# Patient Record
Sex: Male | Born: 1960 | Race: White | Hispanic: No | Marital: Married | Smoking: Former smoker
Health system: Southern US, Community
[De-identification: ages and names within clinical notes are randomized; demographics above are authoritative.]

## PROBLEM LIST (undated history)

## (undated) DIAGNOSIS — E119 Type 2 diabetes mellitus without complications: Secondary | ICD-10-CM

## (undated) DIAGNOSIS — E785 Hyperlipidemia, unspecified: Secondary | ICD-10-CM

## (undated) DIAGNOSIS — I2510 Atherosclerotic heart disease of native coronary artery without angina pectoris: Secondary | ICD-10-CM

## (undated) DIAGNOSIS — I456 Pre-excitation syndrome: Secondary | ICD-10-CM

## (undated) DIAGNOSIS — I10 Essential (primary) hypertension: Secondary | ICD-10-CM

## (undated) DIAGNOSIS — E781 Pure hyperglyceridemia: Secondary | ICD-10-CM

## (undated) HISTORY — DX: Essential (primary) hypertension: I10

## (undated) HISTORY — DX: Hyperlipidemia, unspecified: E78.5

## (undated) HISTORY — DX: Pre-excitation syndrome: I45.6

## (undated) HISTORY — DX: Pure hyperglyceridemia: E78.1

## (undated) HISTORY — DX: Type 2 diabetes mellitus without complications: E11.9

## (undated) HISTORY — DX: Atherosclerotic heart disease of native coronary artery without angina pectoris: I25.10

---

## 2003-01-15 ENCOUNTER — Inpatient Hospital Stay (HOSPITAL_COMMUNITY): Admission: AD | Admit: 2003-01-15 | Discharge: 2003-01-16 | Payer: Self-pay | Admitting: Cardiology

## 2003-01-27 ENCOUNTER — Ambulatory Visit (HOSPITAL_COMMUNITY): Admission: RE | Admit: 2003-01-27 | Discharge: 2003-01-27 | Payer: Self-pay | Admitting: Internal Medicine

## 2003-01-27 ENCOUNTER — Encounter: Payer: Self-pay | Admitting: Cardiology

## 2003-09-23 ENCOUNTER — Encounter: Payer: Self-pay | Admitting: Cardiology

## 2003-11-13 ENCOUNTER — Encounter: Payer: Self-pay | Admitting: Cardiology

## 2003-11-18 ENCOUNTER — Encounter: Payer: Self-pay | Admitting: Cardiology

## 2003-11-23 ENCOUNTER — Inpatient Hospital Stay (HOSPITAL_BASED_OUTPATIENT_CLINIC_OR_DEPARTMENT_OTHER): Admission: RE | Admit: 2003-11-23 | Discharge: 2003-11-23 | Payer: Self-pay | Admitting: Cardiovascular Disease

## 2003-11-23 ENCOUNTER — Ambulatory Visit: Payer: Self-pay | Admitting: Cardiovascular Disease

## 2003-12-04 ENCOUNTER — Ambulatory Visit: Payer: Self-pay | Admitting: Cardiology

## 2004-11-22 IMAGING — NM NM MYOCAR MULTI W/ SPECT
15 series · 60 of 60 positions shown · non-contrast
Comparison: none

CLINICAL DATA: Wolff-Parkinson-White syndrome.  Atrial fibrillation and family history of coronary artery disease. 
 No prior studies for comparison. 
 NUCLEAR MEDICINE MYOCARDIAL PERFUSION AND MULTIPLE SPECT IMAGING WITH REST AND EXERCISE STRESS ? 01/16/03 
 EJECTION FRACTION CALCULATION
 WALL MOTION STUDY
 Radiopharmaceutical:  10.0 mCi technetium-33m sestamibi IV at rest and 30.0 mCi technetium-33m sestamibi IV with exercise stress.
 Utilizing gated data, the end-diastolic volume is estimated to be 124 cc and the end-systolic volume 55 cc.  Calculated ejection fraction is 55%.

[Series 1: cr cardiolite low dose · 6.8mm · 6.79mm/px · 4 of 25 frames shown (1 of 7)]
[frame 3/25]
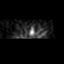
[frame 11/25]
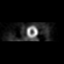
[frame 15/25]
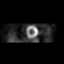
[frame 23/25]
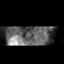

[Series 1: cs cardiolite hi dose · 6.8mm · 6.79mm/px · 4 of 25 frames shown (1 of 8)]
[frame 3/25]
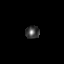
[frame 11/25]
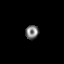
[frame 15/25]
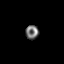
[frame 23/25]
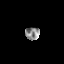

[Series 1: cr cardiolite low dose · 6.8mm · 6.79mm/px · 4 of 19 frames shown (2 of 7)]
[frame 2/19]
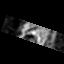
[frame 8/19]
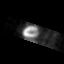
[frame 11/19]
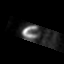
[frame 18/19]
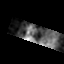

[Series 1: cs cardiolite hi dose · 6.8mm · 6.79mm/px · 4 of 19 frames shown (2 of 8)]
[frame 2/19]
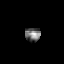
[frame 8/19]
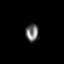
[frame 11/19]
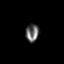
[frame 18/19]
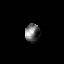

[Series 1: cr cardiolite low dose · 6.8mm · 6.79mm/px · 4 of 19 frames shown (3 of 7)]
[frame 2/19]
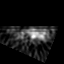
[frame 8/19]
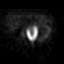
[frame 11/19]
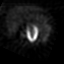
[frame 18/19]
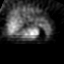

[Series 1: cs cardiolite hi dose · 6.79mm/px · 4 of 64 frames shown (3 of 8)]
[frame 16/64]
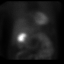
[frame 27/64]
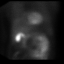
[frame 48/64]
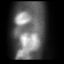
[frame 59/64]
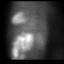

[Series 1: cr cardiolite low dose · 6.8mm · 6.79mm/px · 4 of 25 frames shown (4 of 7)]
[frame 7/25]
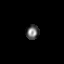
[frame 11/25]
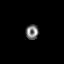
[frame 19/25]
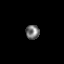
[frame 23/25]
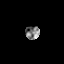

[Series 1: cs cardiolite hi dose · 6.8mm · 6.79mm/px · 4 of 19 frames shown (4 of 8)]
[frame 5/19]
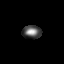
[frame 8/19]
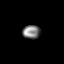
[frame 14/19]
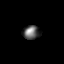
[frame 18/19]
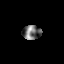

[Series 1: cs cardiolite hi dose · 6.79mm/px · 4 of 510 frames shown (5 of 8)]
[frame 128/510]
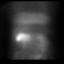
[frame 213/510]
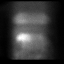
[frame 383/510]
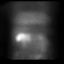
[frame 468/510]
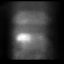

[Series 1: cs cardiolite hi dose · 6.8mm · 6.79mm/px · 4 of 25 frames shown (6 of 8)]
[frame 3/25]
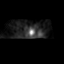
[frame 7/25]
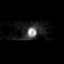
[frame 15/25]
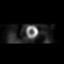
[frame 19/25]
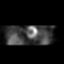

[Series 1: cr cardiolite low dose · 6.8mm · 6.79mm/px · 4 of 19 frames shown (5 of 7)]
[frame 2/19]
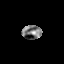
[frame 8/19]
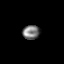
[frame 11/19]
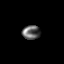
[frame 18/19]
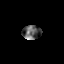

[Series 1: cs cardiolite hi dose · 6.8mm · 6.79mm/px · 4 of 19 frames shown (7 of 8)]
[frame 2/19]
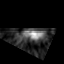
[frame 8/19]
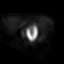
[frame 11/19]
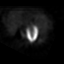
[frame 18/19]
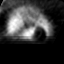

[Series 1: cr cardiolite low dose · 6.8mm · 6.79mm/px · 4 of 19 frames shown (6 of 7)]
[frame 2/19]
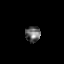
[frame 8/19]
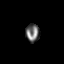
[frame 11/19]
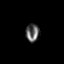
[frame 18/19]
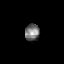

[Series 1: cs cardiolite hi dose · 6.8mm · 6.79mm/px · 4 of 19 frames shown (8 of 8)]
[frame 2/19]
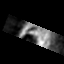
[frame 8/19]
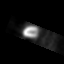
[frame 11/19]
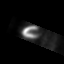
[frame 18/19]
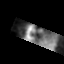

[Series 1: cr cardiolite low dose · 6.79mm/px · 4 of 64 frames shown (7 of 7)]
[frame 6/64]
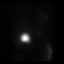
[frame 27/64]
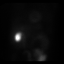
[frame 38/64]
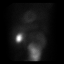
[frame 59/64]
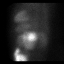

[60 of 60 positions shown; findings below may reference images not displayed]

IMPRESSION: QGS LV ejection fraction of 55%.
 WALL MOTION ANALYSIS
 The gated wall motion study shows normal left ventricular wall motion.
IMPRESSION: Normal wall motion study.
 MULTIPLE SPECT IMAGING WITH REST AND EXERCISE STRESS 
 The patient was exercised on a treadmill, achieving a maximal heart rate of 164 beats per minute.  This is just above 90% of predicted maximum for age.  The patient achieved stage IV of Bruce protocol.  SPECT imaging is normal on both rest and stress studies with no evidence of inducible ischemia or fixed scar.  Very minimal attenuation of the basilar portion of the inferior wall on both stress and rest studies, which is confirmed to be secondary to the diaphragm on the raw planar images.
IMPRESSION: No evidence of ischemia or scar.

## 2004-12-29 ENCOUNTER — Encounter: Payer: Self-pay | Admitting: Cardiology

## 2005-10-06 ENCOUNTER — Ambulatory Visit: Payer: Self-pay | Admitting: Cardiology

## 2009-03-10 ENCOUNTER — Encounter: Payer: Self-pay | Admitting: Cardiology

## 2009-06-13 ENCOUNTER — Ambulatory Visit: Payer: Self-pay | Admitting: Internal Medicine

## 2009-06-13 ENCOUNTER — Encounter: Payer: Self-pay | Admitting: Cardiology

## 2009-06-13 ENCOUNTER — Inpatient Hospital Stay (HOSPITAL_COMMUNITY): Admission: EM | Admit: 2009-06-13 | Discharge: 2009-06-16 | Payer: Self-pay | Admitting: Internal Medicine

## 2009-06-15 ENCOUNTER — Encounter: Payer: Self-pay | Admitting: Cardiology

## 2009-06-16 ENCOUNTER — Encounter: Payer: Self-pay | Admitting: Cardiology

## 2009-06-24 DIAGNOSIS — E782 Mixed hyperlipidemia: Secondary | ICD-10-CM | POA: Insufficient documentation

## 2009-06-25 ENCOUNTER — Encounter (INDEPENDENT_AMBULATORY_CARE_PROVIDER_SITE_OTHER): Payer: Self-pay | Admitting: *Deleted

## 2009-06-25 ENCOUNTER — Ambulatory Visit: Payer: Self-pay | Admitting: Cardiology

## 2009-06-25 DIAGNOSIS — F17200 Nicotine dependence, unspecified, uncomplicated: Secondary | ICD-10-CM | POA: Insufficient documentation

## 2009-06-25 DIAGNOSIS — I2510 Atherosclerotic heart disease of native coronary artery without angina pectoris: Secondary | ICD-10-CM | POA: Insufficient documentation

## 2009-06-25 DIAGNOSIS — I456 Pre-excitation syndrome: Secondary | ICD-10-CM | POA: Insufficient documentation

## 2009-07-12 ENCOUNTER — Telehealth (INDEPENDENT_AMBULATORY_CARE_PROVIDER_SITE_OTHER): Payer: Self-pay | Admitting: *Deleted

## 2009-08-27 ENCOUNTER — Encounter: Payer: Self-pay | Admitting: Cardiology

## 2009-09-06 ENCOUNTER — Ambulatory Visit: Payer: Self-pay | Admitting: Cardiology

## 2009-11-09 ENCOUNTER — Encounter: Payer: Self-pay | Admitting: Cardiology

## 2010-01-07 ENCOUNTER — Encounter: Payer: Self-pay | Admitting: Cardiology

## 2010-01-13 ENCOUNTER — Encounter (INDEPENDENT_AMBULATORY_CARE_PROVIDER_SITE_OTHER): Payer: Self-pay | Admitting: *Deleted

## 2010-01-13 ENCOUNTER — Telehealth (INDEPENDENT_AMBULATORY_CARE_PROVIDER_SITE_OTHER): Payer: Self-pay | Admitting: *Deleted

## 2010-01-14 ENCOUNTER — Ambulatory Visit: Payer: Self-pay | Admitting: Cardiology

## 2010-01-27 ENCOUNTER — Encounter: Payer: Self-pay | Admitting: Cardiology

## 2010-02-15 NOTE — Progress Notes (Signed)
Summary: Nausea/Diaphoresis  Phone Note Call from Patient Call back at Home Phone [phone]   Caller: Tamela Oddi -WIFE Summary of Call: Started sweating very bad and became fatigue while in church yesterday. Came home from church and went to bed. States he is feeling ok today but very concerned since he recently had heart attack. No shortness of breath. Initial call taken by: Zachary George,  July 12, 2009 8:56 AM  Follow-up for Phone Call        Pt states he felt good yesterday (6/26) morning. He took meds and ate a sandwich and then went to church. About 30 minutes after arriving at church, he broke out in a cold sweat, felt nauseated and became pale. He left church and rested. He states symptoms passed and he has felt okay since then. Upon further questioning, he states he had a muscle ache on top of his (L) shoulder. He states this was minor and he is not sure if he had this ache before his other symptoms started. He states he just have paid more attention to it after his other symptoms started. Pt states he is going out of town on vacation tomorrow and wanted to notify us of his symptoms before leaving for vacation.  Follow-up by: Cyril Loosen, RN, BSN,  July 12, 2009 11:12 AM  Additional Follow-up for Phone Call Additional follow up Details #1::        Would keep an eye on things and not hesitate to seek medical attention if symptoms recur/worsen while out of town. Additional Follow-up by: Loreli Slot, MD, [facility],  July 12, 2009 2:17 PM    Additional Follow-up for Phone Call Additional follow up Details #2::    Pt's wife notified and verbalized understanding. Pt's wife also states that she went to [facility] to pick up Lisinopril. Pt had 90-day prescription sent at recent office visit d/t cost. Pt's wife was told by pharmacy that prescription was sent by MD for 30 day supply only. This is not correct. Pt's wife notified prescription will be resent for 90 day supply.    Follow-up by: Cyril Loosen, RN, BSN,  July 12, 2009 3:54 PM  Prescriptions: LISINOPRIL 10 MG TABS (LISINOPRIL) Take 1 tablet by mouth once a day  #90 x 3   Entered by:   Cyril Loosen, RN, BSN   Authorized by:   Loreli Slot, MD, [facility]   Signed by:   Cyril Loosen, RN, BSN on 07/12/2009   Method used:   Electronically to        [facility]* (retail)       [address]       [place], [place]  [address]       Ph: [phone]       Fax: [phone]   RxID:   [id number]

## 2010-02-15 NOTE — Op Note (Signed)
Summary: Operative Report  Operative Report   Imported By: Dorise Hiss 06/24/2009 15:56:02  _____________________________________________________________________  External Attachment:    Type:   Image     Comment:   External Document

## 2010-02-15 NOTE — Letter (Signed)
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 [address]   [place], [place] [address]   Phone: [phone]  Fax: [phone]    06/25/2009  TO: Leodis Sias IT MAY CONCERN   RE: [name] [address] [id number]   The above named individual is under my medical care and may return to work on: July 13, 2009 without restrictions.  If you have any further questions or need additional information, please call.     Sincerely,    Lawrence Loosen, RN, BSN

## 2010-02-15 NOTE — Assessment & Plan Note (Signed)
Summary: EPH-POST [facility]   Visit Type:  Follow-up Primary Provider:  Dr. Donzetta Sprung   History of Present Illness: 50 year old Lawrence [name] presents for post hospital followup. I have not seen him in the office since 2007. He was admitted to [facility] in May with a non-ST elevation myocardial infarction, and underwent cardiac catheterization detailed below. In summary he was found to have moderate disease within the left anterior descending, and 80% stenoses within the circumflex and marginal distribution, treated successfully with a single drug-eluting stent. He tolerated the procedure well.  Peak troponin level was 2.3 during hospitalization. Lipid profile revealed cholesterol 208, triglycerides 214, HDL 27, and LDL 138.  He is here with his wife today. He reports generally doing better, feeling stronger each day. He has had no recurrent chest pain. He reports compliance with medications. He has not yet returned to work.  He states he has been trying to walk on a daily basis. He also quit smoking a few weeks ago. I congratulated him on this. Electrocardiogram is stable.  Preventive Screening-Counseling & Management  Alcohol-Tobacco     Smoking Status: quit  Comments: Pt quit smoking 2 wks ago. He smoked for 30+ yrs.  Current Medications (verified): 1)  Plavix 75 Mg Tabs (Clopidogrel Bisulfate) .... Take 1 Tablet By Mouth Once A Day 2)  Lisinopril 10 Mg Tabs (Lisinopril) .... Take 1 Tablet By Mouth Once A Day 3)  Metoprolol Tartrate 50 Mg Tabs (Metoprolol Tartrate) .... Take 1 Tablet By Mouth Two Times A Day 4)  Nicoderm Cq 21 Mg/24hr Pt24 (Nicotine) .... Use As Directed 5)  Nitrostat 0.4 Mg Subl (Nitroglycerin) .... Use As Dircted 6)  Crestor 40 Mg Tabs (Rosuvastatin Calcium) .... Take 1 Tablet By Mouth Once A Day Lawrence)  Aspirin 325 Mg Tabs (Aspirin) .... Take 1 Tablet By Mouth Once A Day  Allergies (verified): No Known Drug Allergies  Comments:  Nurse/Medical Assistant: The  patient's medications were reviewed with the patient and were updated in the Medication List. Pt brought medication bottles to office visit.  Cyril Loosen, RN, BSN (June 25, 2009 2:20 PM)  Past History:  Social History: Last updated: 06/25/2009 Married  Tobacco Use - Yes Full Time  Past Medical History: Wolff-Parkinson-White syndrome status post RFA (Dr. Graciela Husbands) Hypertension CAD - DES circ/OM 5/11, residual 50-70% LAD, LVEF 60% Hyperlipidemia  Past Surgical History: Unremarkable  Family History: Father: CAD with MI in 35s Mother: CAD with MI in 72s  Social History: Married  Tobacco Use - Yes Full Time Smoking Status:  quit  Review of Systems  The patient denies anorexia, fever, chest pain, syncope, dyspnea on exertion, peripheral edema, melena, hematochezia, and severe indigestion/heartburn.         Otherwise reviewed and negative.  Vital Signs:  Patient profile:   50 year old Lawrence [name] Height:      72 inches Weight:      218.75 pounds BMI:     29.78 Pulse rate:   71 / minute BP sitting:   119 / 77  (left arm) Cuff size:   regular  Vitals Entered By: Cyril Loosen, RN, BSN (June 25, 2009 2:18 PM)  Nutrition Counseling: Patient's BMI is greater than 25 and therefore counseled on weight management options. Comments follow up visit   Physical Exam  Additional Exam:  Overweight Lawrence [name] in no acute distress. HEENT: Conjunctiva and lids normal, oropharynx clear. Neck: Supple, no JVP or bruits. Lungs: Clear auscultation, but appeared Cardiac: Regular rate and rhythm, no  S3. Abdomen: Soft, nontender, bowel sounds present. Extremities: No pitting edema, distal pulses full. Skin: Warm and dry. Musculoskeletal: No gross deformities. Neuropsychiatric: Alert and oriented x3, affect appropriate.   Cardiac Cath  Procedure date:  06/15/2009  Findings:       RESULTS:   1. Left main coronary artery:  Left main coronary artery was free of       significant disease.    2. Left anterior descending artery:  Left anterior descending artery       gave rise to septal perforator and 2 diagonal branches.  The LAD       was diffusely irregular.  There was 50% to 70% narrowing in the       midvessel.   3. Circumflex artery:  Left anterior descending artery gave rise to a       large marginal branch and an AV branch.  There was 40% proximal       stenosis.  There was 80% mid stenosis before the marginal branch.       There was 80% stenosis in the proximal portion of the marginal       branch and 40% narrowing in the distal portion of the marginal       branch.   4. Right coronary artery:  Right coronary artery was a moderate-sized       vessel, gave rise to a right ventricular branch, posterior       descending branch, and a posterolateral branch.  These vessels were       free of major obstruction.   5. Left ventriculogram:  Left ventriculogram performed in the RAO       projection showed good wall motion with no areas of hypokinesis.       The estimated ejection fraction was 60%.   6. The aortic pressure was 170/91 with a mean of 124.  Left       ventricular pressure was 170/22.      Following stenting of the lesions in the proximal mid circumflex artery   and marginal branch, the stenoses improved from 80% and 80% to 0%.  We   covered both lesions with one stent.  EKG  Procedure date:  06/25/2009  Findings:      Normal sinus rhythm at 66 beats per minute.  Impression & Recommendations:  Problem # 1:  CORONARY ATHEROSCLEROSIS NATIVE CORONARY ARTERY (ICD-414.01)  Patient status post non-ST elevation myocardial infarction back in late May, followed by revascularization with drug-eluting stent placement to the circumflex/OM, and medically managed residual LAD disease. He is doing well at this point, recuperating with no significant angina. Would anticipate return to work in approximately 2 weeks, giving him 4 weeks from his event, without specific  restrictions at that time. He states he is not able to pursue formal cardiac rehabilitation, so we talked about a regular walking regimen and gradually increasing his exercise. I will see him back in 2 months.  His updated medication list for this problem includes:    Plavix 75 Mg Tabs (Clopidogrel bisulfate) .Marland Kitchen... Take 1 tablet by mouth once a day    Lisinopril 10 Mg Tabs (Lisinopril) .Marland Kitchen... Take 1 tablet by mouth once a day    Metoprolol Tartrate 50 Mg Tabs (Metoprolol tartrate) .Marland Kitchen... Take 1 tablet by mouth two times a day    Nitrostat 0.4 Mg Subl (Nitroglycerin) ..... Use as dircted    Aspirin 325 Mg Tabs (Aspirin) .Marland Kitchen... Take 1 tablet by mouth once a day  Problem # 2:  WOLFF (WOLFE)-PARKINSON-WHITE (WPW) SYNDROME (ICD-426.Lawrence)  Status post radiofrequency ablation and quiescent.  His updated medication list for this problem includes:    Plavix 75 Mg Tabs (Clopidogrel bisulfate) .Marland Kitchen... Take 1 tablet by mouth once a day    Lisinopril 10 Mg Tabs (Lisinopril) .Marland Kitchen... Take 1 tablet by mouth once a day    Metoprolol Tartrate 50 Mg Tabs (Metoprolol tartrate) .Marland Kitchen... Take 1 tablet by mouth two times a day    Nitrostat 0.4 Mg Subl (Nitroglycerin) ..... Use as dircted    Aspirin 325 Mg Tabs (Aspirin) .Marland Kitchen... Take 1 tablet by mouth once a day  Problem # 3:  TOBACCO ABUSE (ICD-305.1)  Patient quit smoking approximately 2 weeks ago.  Problem # 4:  HYPERLIPIDEMIA (ICD-272.4)  Will obtain fasting lipid profile and liver function tests just prior to his next visit.  His updated medication list for this problem includes:    Crestor 40 Mg Tabs (Rosuvastatin calcium) .Marland Kitchen... Take 1 tablet by mouth once a day  Future Orders: T-Hepatic Function [id number]) ... 08/23/2009 T-Lipid Profile [id number]) ... 08/23/2009  Patient Instructions: 1)  Follow up appt with Dr. Diona Browner on Monday, September 06, 2009 at 3:20pm. 2)  Your physician recommends that you go to the [facility] for a FASTING lipid profile and  liver function labs:  DO IN 8 WKS BEFORE APPT. Prescriptions: LISINOPRIL 10 MG TABS (LISINOPRIL) Take 1 tablet by mouth once a day  #90 x 3   Entered by:   Cyril Loosen, RN, BSN   Authorized by:   Loreli Slot, MD, [facility]   Signed by:   Cyril Loosen, RN, BSN on 06/25/2009   Method used:   Electronically to        [facility]* (retail)       [address]       [place], [place]  [address]       Ph: [phone]       Fax: [phone]   RxID:   [id number]

## 2010-02-15 NOTE — Letter (Signed)
Summary: Discharge Summary  Discharge Summary   Imported By: Dorise Hiss 06/24/2009 15:57:05  _____________________________________________________________________  External Attachment:    Type:   Image     Comment:   External Document

## 2010-02-15 NOTE — Assessment & Plan Note (Signed)
Summary: 2 MO F/U PER 6/10 OV-JM   Visit Type:  Follow-up Primary Provider:  Dr. Donzetta Sprung   History of Present Illness: 50 year old male presents for followup. I saw him back in June. He continues to do well without any significant angina or unusual shortness of breath. He reports compliance with medications.  Followup labs from August showed AST 18, ALT 19, cholesterol 118, triglycerides 190, HDL 25, LDL 55. We reviewed these today. Crestor is being cut back to 20 mg daily.  Today we talked about his residual coronary artery disease. We discussed diet and regular exercise. Also compliance with medications.  He remains a nonsmoker at this point.  Clinical Review Panels:  Cardiac Imaging Cardiac Cath Findings  RESULTS:   1. Left main coronary artery:  Left main coronary artery was free of       significant disease.   2. Left anterior descending artery:  Left anterior descending artery       gave rise to septal perforator and 2 diagonal branches.  The LAD       was diffusely irregular.  There was 50% to 70% narrowing in the       midvessel.   3. Circumflex artery:  Left anterior descending artery gave rise to a       large marginal branch and an AV branch.  There was 40% proximal       stenosis.  There was 80% mid stenosis before the marginal branch.       There was 80% stenosis in the proximal portion of the marginal       branch and 40% narrowing in the distal portion of the marginal       branch.   4. Right coronary artery:  Right coronary artery was a moderate-sized       vessel, gave rise to a right ventricular branch, posterior       descending branch, and a posterolateral branch.  These vessels were       free of major obstruction.   5. Left ventriculogram:  Left ventriculogram performed in the RAO       projection showed good wall motion with no areas of hypokinesis.       The estimated ejection fraction was 60%.   6. The aortic pressure was 170/91 with a mean of 124.   Left       ventricular pressure was 170/22.      Following stenting of the lesions in the proximal mid circumflex artery   and marginal branch, the stenoses improved from 80% and 80% to 0%.  We   covered both lesions with one stent. (06/15/2009)    Preventive Screening-Counseling & Management  Alcohol-Tobacco     Smoking Status: quit     Year Started: 36 yrs     Year Quit: 06/15/2009  Current Medications (verified): 1)  Plavix 75 Mg Tabs (Clopidogrel Bisulfate) .... Take 1 Tablet By Mouth Once A Day 2)  Lisinopril 10 Mg Tabs (Lisinopril) .... Take 1 Tablet By Mouth Once A Day 3)  Metoprolol Tartrate 50 Mg Tabs (Metoprolol Tartrate) .... Take 1 Tablet By Mouth Two Times A Day 4)  Nitrostat 0.4 Mg Subl (Nitroglycerin) .... Use As Dircted 5)  Crestor 40 Mg Tabs (Rosuvastatin Calcium) .... Take 1 Tablet By Mouth Once A Day 6)  Aspirin 325 Mg Tabs (Aspirin) .... Take 1 Tablet By Mouth Once A Day  Allergies (verified): No Known Drug Allergies  Past History:  Past Medical History:  Last updated: 06/25/2009 Wolff-Parkinson-White syndrome status post RFA (Dr. Graciela Husbands) Hypertension CAD - DES circ/OM 5/11, residual 50-70% LAD, LVEF 60% Hyperlipidemia  Social History: Last updated: 06/25/2009 Married  Tobacco Use - Yes Full Time  Review of Systems  The patient denies anorexia, fever, chest pain, syncope, dyspnea on exertion, peripheral edema, melena, and hematochezia.         Otherwise reviewed and negative.  Vital Signs:  Patient profile:   50 year old male Height:      72 inches Weight:      226.50 pounds Pulse rate:   70 / minute BP sitting:   128 / 79  (left arm) Cuff size:   regular  Vitals Entered By: Hoover Brunette, LPN (September 06, 2009 1:36 PM) Is Patient Diabetic? No   Physical Exam  Additional Exam:  Overweight male in no acute distress. HEENT: Conjunctiva and lids normal, oropharynx clear. Neck: Supple, no JVP or bruits. Lungs: Clear auscultation, but  appeared Cardiac: Regular rate and rhythm, no S3. Abdomen: Soft, nontender, bowel sounds present. Extremities: No pitting edema, distal pulses full. Skin: Warm and dry. Musculoskeletal: No gross deformities. Neuropsychiatric: Alert and oriented x3, affect appropriate.   Impression & Recommendations:  Problem # 1:  CORONARY ATHEROSCLEROSIS NATIVE CORONARY ARTERY (ICD-414.01)  Symptomatically stable on medical therapy. Continue on dual antiplatelet therapy. Followup scheduled for 4 months.  His updated medication list for this problem includes:    Plavix 75 Mg Tabs (Clopidogrel bisulfate) .Marland Kitchen... Take 1 tablet by mouth once a day    Lisinopril 10 Mg Tabs (Lisinopril) .Marland Kitchen... Take 1 tablet by mouth once a day    Metoprolol Tartrate 50 Mg Tabs (Metoprolol tartrate) .Marland Kitchen... Take 1 tablet by mouth two times a day    Nitrostat 0.4 Mg Subl (Nitroglycerin) ..... Use as dircted    Aspirin 325 Mg Tabs (Aspirin) .Marland Kitchen... Take 1 tablet by mouth once a day  Problem # 2:  TOBACCO ABUSE (ICD-305.1)  Patient remains a nonsmoker.  Problem # 3:  HYPERLIPIDEMIA (ICD-272.4)  Recent LDL at goal. Crestor being reduced from 40 mg daily to 20 mg daily. Followup fasting lipid profile and liver function tests for next visit.  His updated medication list for this problem includes:    Crestor 20 Mg Tabs (Rosuvastatin calcium) .Marland Kitchen... Take one tablet by mouth daily.  Patient Instructions: 1)  Your physician wants you to follow-up in: 4 months. You will receive a reminder letter in the mail one-two months in advance. If you don't receive a letter, please call our office to schedule the follow-up appointment. 2)  Your physician recommends that you go to the [facility] for a FASTING lipid profile and liver function labs:  DO BEFORE OFFICE VISIT IN 4 MONTHS. 3)  Decrease Crestor to 20mg  by mouth at bedtime.

## 2010-02-17 NOTE — Miscellaneous (Signed)
Summary: Orders Update  Clinical Lists Changes  Problems: Added new problem of LONG-TERM (CURRENT) USE OF OTHER MEDICATIONS (ICD-V58.69) Orders: Added new Test order of T-Hepatic Function [id number]) - Signed Added new Test order of T-Lipid Profile [id number]) - Signed

## 2010-02-17 NOTE — Progress Notes (Signed)
Summary: PHONE: REFILL PLAVIX  Phone Note Call from Patient Call back at Home Phone [phone]   Caller: wife-Betsy  Reason for Call: Refill Medication Summary of Call: Plavix 75 Mg Tabs (Clopidogrel Bisulfate) .... Take 1 Tablet By Mouth Once A Day  -needs refill Has appt with Dr. Diona Browner on 01-27-10 we had to reschedule his appointment.  [facility] Initial call taken by: Zachary George,  January 13, 2010 10:13 AM    Prescriptions: PLAVIX 75 MG TABS (CLOPIDOGREL BISULFATE) Take 1 tablet by mouth once a day  #30 x 3   Entered by:   Carlye Grippe   Authorized by:   Loreli Slot, MD, [facility]   Signed by:   Carlye Grippe on 01/13/2010   Method used:   Electronically to        [facility]* (retail)       [address]       [place], [place]  [address]       Ph: [phone]       Fax: [phone]   RxID:   [id number]

## 2010-02-17 NOTE — Letter (Signed)
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   [place], [place] [address]   Phone: [phone]  Fax: [phone]        January 13, 2010 MRN: [id number]    Lawrence [name] [address] [place], [place]  [address]    Dear [name],  Your test ordered by Selena Batten has been reviewed by your physician (or physician assistant) and was found to be normal or stable. Your physician (or physician assistant) felt no changes were needed at this time.  ____ Echocardiogram  ____ Cardiac Stress Test  __X__ Lab Work-Dr. Diona Browner will discuss results with your further at follow up office visit.  ____ Peripheral vascular study of arms, legs or neck  ____ CT scan or X-ray  ____ Lung or Breathing test  ____ Other:   Thank you.   Cyril Loosen, RN, BSN    Duane Boston, M.D., F.A.C.C. Thressa Sheller, M.D., F.A.C.C. Oneal Grout, M.D., F.A.C.C. Cheree Ditto, M.D., F.A.C.C. Daiva Nakayama, M.D., F.A.C.C. Kenney Houseman, M.D., F.A.C.C. Jeanne Ivan, PA-C

## 2010-02-17 NOTE — Assessment & Plan Note (Signed)
Summary: Lawrence [name]   Visit Type:  Follow-up Primary Provider:  Dr. Donzetta Sprung   History of Present Illness: 50 year old male presents for followup. He was seen back in August 2011. Reports no progressive symptomatology, specifically no angina or unusual shortness of breath. He has been able to quit smoking and has been successful so far. Has gained some weight, and is trying to get back to a regular exercise regimen.  Lab work from 23 December showed AST 23, ALT 30, cholesterol 165, triglycerides 257, HDL 35, LDL 79. We reviewed these today.  Today we reviewed his medications. ECG is reviewed below.  Preventive Screening-Counseling & Management  Alcohol-Tobacco     Smoking Status: quit  Current Medications (verified): 1)  Plavix 75 Mg Tabs (Clopidogrel Bisulfate) .... Take 1 Tablet By Mouth Once A Day 2)  Lisinopril 10 Mg Tabs (Lisinopril) .... Take 1 Tablet By Mouth Once A Day 3)  Metoprolol Tartrate 50 Mg Tabs (Metoprolol Tartrate) .... Take 1 Tablet By Mouth Two Times A Day Lawrence)  Nitrostat 0.Lawrence Mg Subl (Nitroglycerin) .... Use As Dircted 5)  Crestor 20 Mg Tabs (Rosuvastatin Calcium) .... Take One Tablet By Mouth Daily. 6)  Aspirin 325 Mg Tabs (Aspirin) .... Take 1 Tablet By Mouth Once A Day  Allergies (verified): No Known Drug Allergies  Past History:  Past Medical History: Last updated: 06/25/2009 Wolff-Parkinson-White syndrome status post RFA (Dr. Graciela Husbands) Hypertension CAD - DES circ/OM 5/11, residual 50-70% LAD, LVEF 60% Hyperlipidemia  Social History: Last updated: 01/27/2010 Married  Full Time Tobacco Use - Former.   Social History: Married  Full Time Tobacco Use - Former.   Review of Systems       The patient complains of weight gain.  The patient denies anorexia, fever, chest pain, syncope, dyspnea on exertion, melena, and hematochezia.         Erectile dysfunction. Otherwise reviewed and negative.  Vital Signs:  Patient profile:   50 year old  male Height:      72 inches Weight:      242 pounds Pulse rate:   64 / minute BP sitting:   133 / 84  (left arm) Cuff size:   large  Vitals Entered By: Carlye Grippe (January 27, 2010 9:18 AM)  Physical Exam  Additional Exam:  Overweight male in no acute distress. HEENT: Conjunctiva and lids normal, oropharynx clear. Neck: Supple, no JVP or bruits. Lungs: Clear auscultation, but appeared Cardiac: Regular rate and rhythm, no S3. Abdomen: Soft, nontender, bowel sounds present. Extremities: No pitting edema, distal pulses full. Skin: Warm and dry. Musculoskeletal: No gross deformities. Neuropsychiatric: Alert and oriented x3, affect appropriate.   EKG  Procedure date:  01/27/2010  Findings:      Sinus rhythm at 65 beats per minute, nonspecific ST changes.  Prior Report Reviewed for Cardiac Cath:  Findings: 06/15/2009  RESULTS:   1. Left main coronary artery:  Left main coronary artery was free of       significant disease.   2. Left anterior descending artery:  Left anterior descending artery       gave rise to septal perforator and 2 diagonal branches.  The LAD       was diffusely irregular.  There was 50% to 70% narrowing in the       midvessel.   3. Circumflex artery:  Left anterior descending artery gave rise to a       large marginal branch and an AV branch.  There was  40% proximal       stenosis.  There was 80% mid stenosis before the marginal branch.       There was 80% stenosis in the proximal portion of the marginal       branch and 40% narrowing in the distal portion of the marginal       branch.   Lawrence. Right coronary artery:  Right coronary artery was a moderate-sized       vessel, gave rise to a right ventricular branch, posterior       descending branch, and a posterolateral branch.  These vessels were       free of major obstruction.   5. Left ventriculogram:  Left ventriculogram performed in the RAO       projection showed good wall motion with no areas  of hypokinesis.       The estimated ejection fraction was 60%.   6. The aortic pressure was 170/91 with a mean of 124.  Left       ventricular pressure was 170/22.      Following stenting of the lesions in the proximal mid circumflex artery   and marginal branch, the stenoses improved from 80% and 80% to 0%.  We   covered both lesions with one stent.  Comments:    Impression & Recommendations:  Problem # 1:  CORONARY ATHEROSCLEROSIS NATIVE CORONARY ARTERY (ICD-414.01)  Symptomatically stable medical therapy. ECG is reviewed. No further testing planned at this point. Followup will be arranged over the next 6 months.  His updated medication list for this problem includes:    Plavix 75 Mg Tabs (Clopidogrel bisulfate) .Marland Kitchen... Take 1 tablet by mouth once a day    Lisinopril 10 Mg Tabs (Lisinopril) .Marland Kitchen... Take 1 tablet by mouth once a day    Metoprolol Tartrate 50 Mg Tabs (Metoprolol tartrate) .Marland Kitchen... Take 1 tablet by mouth two times a day    Nitrostat 0.Lawrence Mg Subl (Nitroglycerin) ..... Use as dircted    Aspirin 325 Mg Tabs (Aspirin) .Marland Kitchen... Take 1 tablet by mouth once a day  Orders: EKG w/ Interpretation (93000)  Problem # 2:  WOLFF (WOLFE)-PARKINSON-WHITE (WPW) SYNDROME (ICD-426.7)  Quiescent, status post radiorequency ablation.  His updated medication list for this problem includes:    Plavix 75 Mg Tabs (Clopidogrel bisulfate) .Marland Kitchen... Take 1 tablet by mouth once a day    Lisinopril 10 Mg Tabs (Lisinopril) .Marland Kitchen... Take 1 tablet by mouth once a day    Metoprolol Tartrate 50 Mg Tabs (Metoprolol tartrate) .Marland Kitchen... Take 1 tablet by mouth two times a day    Nitrostat 0.Lawrence Mg Subl (Nitroglycerin) ..... Use as dircted    Aspirin 325 Mg Tabs (Aspirin) .Marland Kitchen... Take 1 tablet by mouth once a day  Problem # 3:  HYPERLIPIDEMIA (ICD-272.Lawrence)  LDL reasonably well controlled, continue present medications.  His updated medication list for this problem includes:    Crestor 20 Mg Tabs (Rosuvastatin calcium) .Marland Kitchen...  Take one tablet by mouth daily.  Problem # Lawrence:  TOBACCO ABUSE (ICD-305.1)  Patient has been able to quit smoking, successful so far.  Patient Instructions: 1)  Your physician wants you to follow-up in: 6 months. You will receive a reminder letter in the mail one-two months in advance. If you don't receive a letter, please call our office to schedule the follow-up appointment. 2)  Your physician recommends that you continue on your current medications as directed. Please refer to the Current Medication list given to you today. 3)  Your physician recommends that you go to the [facility] for a FASTING lipid profile and liver function labs:  DO LABS IN 6 MONTHS BEFORE NEXT OFFICE VISIT. Do not eat or drink after midnight.  Prescriptions: LISINOPRIL 10 MG TABS (LISINOPRIL) Take 1 tablet by mouth once a day  #90 x 3   Entered by:   Cyril Loosen, RN, BSN   Authorized by:   Loreli Slot, MD, [facility]   Signed by:   Cyril Loosen, RN, BSN on 01/27/2010   Method used:   Electronically to        [facility] (retail)       [address]       [place], [place]  [address]       Ph: [phone]       Fax: [phone]   RxID:   [id number]

## 2010-04-04 LAB — BASIC METABOLIC PANEL
BUN: 9 mg/dL (ref 6–23)
BUN: 12 mg/dL (ref 6–23)
BUN: 9 mg/dL (ref 6–23)
CO2: 24 mEq/L (ref 19–32)
CO2: 24 mEq/L (ref 19–32)
CO2: 25 mEq/L (ref 19–32)
Calcium: 8.8 mg/dL (ref 8.4–10.5)
Calcium: 8.8 mg/dL (ref 8.4–10.5)
Calcium: 8.9 mg/dL (ref 8.4–10.5)
Chloride: 106 mEq/L (ref 96–112)
Chloride: 109 mEq/L (ref 96–112)
Chloride: 110 mEq/L (ref 96–112)
Creatinine, Ser: 0.93 mg/dL (ref 0.4–1.5)
Creatinine, Ser: 0.97 mg/dL (ref 0.4–1.5)
Creatinine, Ser: 1.05 mg/dL (ref 0.4–1.5)
GFR calc Af Amer: >60 mL/min (ref >60)
GFR calc Af Amer: >60 mL/min (ref >60)
GFR calc Af Amer: >60 mL/min (ref >60)
GFR calc non Af Amer: >60 mL/min (ref >60)
GFR calc non Af Amer: >60 mL/min (ref >60)
GFR calc non Af Amer: >60 mL/min (ref >60)
Glucose, Bld: 83 mg/dL (ref 70–99)
Glucose, Bld: 91 mg/dL (ref 70–99)
Glucose, Bld: 93 mg/dL (ref 70–99)
Potassium: 3.9 mEq/L (ref 3.5–5.1)
Potassium: 3.8 mEq/L (ref 3.5–5.1)
Potassium: 4.1 mEq/L (ref 3.5–5.1)
Sodium: 138 mEq/L (ref 135–145)
Sodium: 138 mEq/L (ref 135–145)
Sodium: 140 mEq/L (ref 135–145)

## 2010-04-04 LAB — LIPID PANEL
Cholesterol: 208 mg/dL — ABNORMAL HIGH (ref 0–200)
HDL: 27 mg/dL — ABNORMAL LOW (ref >39)
LDL Cholesterol: 138 mg/dL — ABNORMAL HIGH (ref 0–99)
Total CHOL/HDL Ratio: 7.7 RATIO
Triglycerides: 214 mg/dL — ABNORMAL HIGH (ref <150)
VLDL: 43 mg/dL — ABNORMAL HIGH (ref 0–40)

## 2010-04-04 LAB — CBC
HCT: 41 % (ref 39.0–52.0)
HCT: 40.3 % (ref 39.0–52.0)
HCT: 40.9 % (ref 39.0–52.0)
Hemoglobin: 13.9 g/dL (ref 13.0–17.0)
Hemoglobin: 14 g/dL (ref 13.0–17.0)
MCHC: 33.8 g/dL (ref 30.0–36.0)
MCHC: 34.2 g/dL (ref 30.0–36.0)
MCHC: 34.3 g/dL (ref 30.0–36.0)
MCV: 91.9 fL (ref 78.0–100.0)
MCV: 91.7 fL (ref 78.0–100.0)
MCV: 92.3 fL (ref 78.0–100.0)
Platelets: 184 10*3/uL (ref 150–400)
Platelets: 173 10*3/uL (ref 150–400)
Platelets: 179 10*3/uL (ref 150–400)
RBC: 4.47 MIL/uL (ref 4.22–5.81)
RBC: 4.46 MIL/uL (ref 4.22–5.81)
RDW: 15.2 % (ref 11.5–15.5)
RDW: 14.9 % (ref 11.5–15.5)
RDW: 15 % (ref 11.5–15.5)
WBC: 10.9 10*3/uL — ABNORMAL HIGH (ref 4.0–10.5)
WBC: 11.4 10*3/uL — ABNORMAL HIGH (ref 4.0–10.5)

## 2010-04-04 LAB — HEMOGLOBIN A1C
Hgb A1c MFr Bld: 5.9 % — ABNORMAL HIGH (ref <5.7)
Mean Plasma Glucose: 123 mg/dL — ABNORMAL HIGH (ref <117)

## 2010-04-04 LAB — CARDIAC PANEL(CRET KIN+CKTOT+MB+TROPI)
CK, MB: 14.8 ng/mL (ref 0.3–4.0)
CK, MB: 26.7 ng/mL (ref 0.3–4.0)
CK, MB: 29.6 ng/mL (ref 0.3–4.0)
Relative Index: 10.1 — ABNORMAL HIGH (ref 0.0–2.5)
Relative Index: 13.8 — ABNORMAL HIGH (ref 0.0–2.5)
Relative Index: 14.5 — ABNORMAL HIGH (ref 0.0–2.5)
Total CK: 146 U/L (ref 7–232)
Total CK: 194 U/L (ref 7–232)
Total CK: 204 U/L (ref 7–232)
Troponin I: 1.55 ng/mL (ref 0.00–0.06)
Troponin I: 1.69 ng/mL (ref 0.00–0.06)
Troponin I: 2.36 ng/mL (ref 0.00–0.06)

## 2010-04-04 LAB — HEPARIN LEVEL (UNFRACTIONATED)
Heparin Unfractionated: 0.13 IU/mL — ABNORMAL LOW (ref 0.30–0.70)
Heparin Unfractionated: 0.29 IU/mL — ABNORMAL LOW (ref 0.30–0.70)
Heparin Unfractionated: 0.41 IU/mL (ref 0.30–0.70)

## 2010-04-04 LAB — MRSA PCR SCREENING: MRSA by PCR: NEGATIVE

## 2010-05-11 ENCOUNTER — Other Ambulatory Visit: Payer: Self-pay | Admitting: Cardiology

## 2010-05-12 NOTE — Telephone Encounter (Signed)
[facility] pt. 

## 2010-06-03 NOTE — Assessment & Plan Note (Signed)
[facility]                            [facility] OFF[facility] NOTE   DOMANIQUE, LUCKETT                          MRN:          [id number]  DATE:10/06/2005                            DOB:          06-23-1960    PRIMARY CARE PHYSICIAN:  Donzetta Sprung, MD   REASON FOR VISIT:  Routine follow-up.   HISTORY OF PRESENT ILLNESS:  Mr. Leanos was last in the office in October  2005.  He has a history of Wolff-Parkinson-White syndrome, status post  radiofrequency ablation by Dr. Graciela Husbands in the past and has done quite well  with this.  He also has a history of cardiac catheterization in November  2005 demonstrating no significant coronary artery disease.  He has been  followed with diet-controlled hyperlipidemia by Dr. Reuel Boom and at this point  is taking only an aspirin daily.  He denies having any problems with  significant chest discomfort or palpitations.  He was previously on Lipitor  but discontinued this secondary to perceived side effects, including a  needle-like sensation in his chest that was apparently reproducible with  stopping and restarting the medication.  He is not exercising regularly at  this time but states he like to hunt in season.  He otherwise had no other  specific complaints.   ALLERGIES:  No known drug allergies.   PRESENT MEDICATIONS:  Enteric-coated aspirin 325 mg p.o. daily.   REVIEW OF SYSTEMS:  As in history of present illness.  Otherwise negative.   PHYSICAL EXAMINATION:  VITAL SIGNS:  Blood pressure 132/70, heart rate of  74, weight is 204 pounds.  GENERAL:  The patient is comfortable, in no acute distress.  NECK:  No elevated jugular venous pressure or loud bruits, no thyromegaly or  thyroid tenderness is noted.  LUNGS:  Clear without labored breathing.  CARDIAC:  A regular rate and rhythm without loud murmur or gallop.  EXTREMITIES:  No significant pitting edema.   IMPRESSION AND RECOMMENDATIONS:  1. History of  Wolff-Parkinson-White syndrome, status post radiofrequency      ablation.  The patient is stable symptomatically and doing well.  We      will anticipate a yearly follow-up of symptoms.  2. Previous reassuring cardiac catheterization in November 2005      demonstrating no significant coronary artery disease.  The patient is      denying any angina or dyspnea on exertion.  I would recommend a      strategy of risk factor modification.  He is having lipids followed by      Dr.      Reuel Boom.  Would otherwise recommend him beginning a regular exercise      regimen.  We have discussed smoking cessation in the past as well.                                   Jonelle Sidle, MD   SGM/MedQ  DD:  10/06/2005  DT:  10/09/2005  Job #:  [id number]   cc:  Gar Ponto

## 2010-06-03 NOTE — Discharge Summary (Signed)
NAME:  Lawrence [name], Lawrence [name]                           ACCOUNT NO.:  [id number]   MEDICAL RECORD NO.:  [id number]                   PATIENT TYPE:  INP   LOCATION:  3727                                 FACILITY:  [facility]   PHYSICIAN:  Cecil Cranker, M.D.             DATE OF BIRTH:  Apr 24, 1960   DATE OF ADMISSION:  01/15/2003  DATE OF DISCHARGE:                                 DISCHARGE SUMMARY   DISCHARGE DIAGNOSES:  1. Admitted with palpitations, found to be in atrial fibrillation with rapid     ventricular rate.  2. History of Wolff-Parkinson-White, considered in the past for     radiofrequency ablation.  3. Negative exercise Cardiolite study this admission was ejection fraction     55%.   SECONDARY DIAGNOSES:  1. History of tobacco habituation/chronic obstructive pulmonary disease.  2. Strong family history of coronary artery disease.   DISCHARGE DISPOSITION:  Mr. Lawrence [name] is ready for discharge after being  placed on low-dose beta blocker on admission.  He has been in sinus rhythm  for the last 36 hours of this admission.  He underwent exercise Cardiolite  on January 16, 2003 which was negative for ischemia, ejection fraction 55%.  He has been afebrile.  His vital signs have been stable.  He is achieving  95% oxygen saturation on room air.  His cardiac enzymes have been negative  x2.  Lipid profile was taken.  He will see Dr. Graciela Husbands in follow-up.  The  office will call with an appointment to set up a radiofrequency ablation for  WPW.   PROCEDURE:  January 16, 2003 - exercise Cardiolite study as dictated above.   BRIEF HISTORY:  Mr. Lawrence [name] is a 50 year old male diagnosed with WPW about 6  years ago, was seen by Dr. Graciela Husbands for this.  Ablation was discussed early in  the year 2004 with Dr. Nona Dell.  He underwent a Cardiolite study at  that time which was negative.  He had onset of tachycardia last evening -  January 14, 2003.  He went to the ER in [place], was given adenosine  and  verapamil, Pronestyl, and converted this morning after 10 hours.  He  initially noted heaviness in both arms when he had dysrhythmias.  Electrocardiogram showed atrial fibrillation with rates greater than 200  beats per minute.  Stable on presentation at [facility].  He was in  normal sinus rhythm.  He has multiple risk factors for coronary artery  disease but did have a recent negative Cardiolite.  This was done in May  2004.  The patient will be admitted, observed.  He will be asked to  discontinue Allegra D, use guaifenesin perhaps for rhinitis symptoms.  He  will also be placed on low-dose beta blocker.  The patient goes home with  the following medications:  1. Enteric-coated aspirin 325 mg daily.  2. Metoprolol 50 mg one-half tablet  in the morning and one-half tablet in     the evening.  3. Nicotine patch 21 mg/24 hours apply to upper outer arm daily.  4. Once again, he is to discontinue Allegra D and find alternative method     for controlling his seasonal rhinitis.  We are recommending Allegra     without the D.   Once again, medications are Lopressor, Allegra, aspirin, and transdermal  nicotine patch.  He will have follow-up with Dr. Graciela Husbands.  Dr. Odessa Fleming office  will call him for an appointment in the next two weeks.      Lawrence [name], Anders Simmonds, M.D.    GM/MEDQ  D:  01/16/2003  T:  01/16/2003  Job:  [id number]   cc:   Lawrence [name]  [address], [address]  [place]  [place] [address]  Fax: [phone]   Lawrence [name], M.D. [facility]   Lawrence [name], M.D.

## 2010-06-03 NOTE — Cardiovascular Report (Signed)
NAMEKLEBER, CREAN                 ACCOUNT NO.:  [id number]   MEDICAL RECORD NO.:  [id number]          PATIENT TYPE:  OIB   LOCATION:  6501                         FACILITY:  [facility]   PHYSICIAN:  Charlton Haws, M.D.     DATE OF BIRTH:  26-Mar-1960   DATE OF PROCEDURE:  DATE OF DISCHARGE:                              CARDIAC CATHETERIZATION   PROCEDURE:  Coronary arteriography.   INDICATIONS:  A 50 year old patient with multiple risk factors, previous  ablation, recurrent chest pain.   PROCEDURE:  Standard catheterization was done from the right femoral artery  using 4-French catheters.   ANGIOGRAPHY:  Left main coronary artery was normal.   Left anterior descending artery was normal.   First and second diagonal branches were normal.   Circumflex coronary artery was nondominant.  It was normal.   Right coronary artery was dominant.  It was normal.   RAO VENTRICULOGRAPHY:  RAO ventriculography had some PVCs; however, there  were no wall motion abnormalities.  Ejection fraction was normal at 60%.  There was no gradient across the aortic valve and no MR.  Aortic pressure  was in the range of 128/77.  LV pressure was in the range of 130/6 with a  post A-wave EDP of 13.   IMPRESSION:  Patient has no significant coronary artery disease.  His chest  pain would appear to be noncardiac in etiology.  He tolerated procedure  well.  He will follow up for his general medical care in [place] with Dr.  Dimas Aguas.       PN/MEDQ  D:  11/23/2003  T:  11/23/2003  Job:  [id number]   cc:   Jonelle Sidle, M.D. [facility]   Selinda Flavin  [address]  [place]  [place] [address]  Fax: [phone]

## 2010-06-03 NOTE — Op Note (Signed)
NAME:  Lawrence [name], Lawrence [name]                           ACCOUNT NO.:  [id number]   MEDICAL RECORD NO.:  [id number]                   PATIENT TYPE:  OIB   LOCATION:  6523                                 FACILITY:  [facility]   PHYSICIAN:  Duke Salvia, M.D.               DATE OF BIRTH:  07-Nov-1960   DATE OF PROCEDURE:  01/27/2003  DATE OF DISCHARGE:                                 OPERATIVE REPORT   PREOPERATIVE DIAGNOSIS:  Wolff-Parkinson-White syndrome with preexcited  atrial fibrillation close coupling intervals.   POSTOPERATIVE DIAGNOSIS:  Wolff-Parkinson-White syndrome with preexcited  atrial fibrillation close coupling intervals.   OPERATION PERFORMED:  Invasive electrophysiologic study with radiofrequency  catheter ablation.   DESCRIPTION OF PROCEDURE:  Following the obtaining of informed consent, the  patient was brought to the electrophysiology laboratory and placed on the  fluoroscopic table in supine position.  After routine prep and drape,  cardiac catheterization was performed with local anesthesia and conscious  sedation.  Noninvasive blood pressure monitoring, transcutaneous oxygen  saturation monitoring and end tidal CO2 monitoring and subsequently intra-  arterial monitoring were performed continuously.  At the end of the  procedure, the catheters were removed.  Hemostasis was obtained and the  patient was transferred to the holding area in stable condition.   CATHETERS:  1. A 5 French quadripolar catheter was inserted via the left femoral vein at     the AV junction to measure the His electrogram.  2. A 5 French quadripolar catheter was inserted via the left femoral vein to     the right ventricular apex.  3. A 5 French hexapolar catheter was inserted via the left femoral vein to     the high right atrium.  4. A 6 French octapolar catheter was inserted via the right femoral vein to     the coronary sinus.  5. A 7 French 4 mm deflectable tip catheter was inserted via the  right     femoral artery to mapping sites on the mitral annulus.   Surface leads 1, aVF, and V1 were monitored continuously throughout the  procedure.  Following insertion of the catheters, the stimulation protocol  included incremental atrial pacing, incremental ventricular pacing, single  and double atrial extra stimuli from the coronary sinus and the high right  atrium.  Single ventricular extrastimuli.   RESULTS:  Surface electrocardiogram at basic intervals.  Rhythm sinus initial and sinus final.  Cycle length is 796 msec initial and 803 msec final.  PR interval is 116 msec initial and 156 msec final.  QRS duration is 118 msec initial and 91 msec final.  QT interval is 388 msec initial and 385 msec final.  P wave duration is 117 msec initial and 118 msec final.  Bundle branch block is absent and absent.  Preexcitation is present and absent.  AH interval is 72 msec initial and 100 msec final.  HV  interval is 16 msec initial and 47 msec final.   AV NODAL FUNCTION:  AV Wenckebach post ablation was 360 msec and VA  conduction was dissociated.  AV nodal effective refractory period at pace cycle length of 600 msec was  360 msec and there was no evidence of AV discontinuity.   HIS PURKINJE SYSTEM FUNCTION:  Baseline HP intervals are as noted above.  At  a pace cycle length of 700 msec HP interval was 0, at 600 msec was -32 msec,  at 500 msec was -51 msec, and at 400 msec was -56 msec.   Accessory pathway function was present:  The patient was pre-excited and had  retrograde conduction over the accessory pathway.  Despite numerous  attempts, the only arrhythmia that was inducible was atrial fibrillation and  so it was elected to proceed with mapping during atrial and ventricular  pacing.   At the mitral annulus at site 6 to 8 radiofrequency energy successfully  interrupted antegrade and retrograde conduction after a total of 4.8  seconds.  Thereafter recurrent of conduction across  the accessory pathway  was not noted.   RADIOFREQUENCY ENERGY:  The patient received a total of two minutes and 31  seconds of RF energy.  On two occasions, accessory pathway conduction was  interrupted.  The first occasion occurred after 9 second of RF and recurred  almost immediately after termination of RF.  The second occasion, it  terminated after 4.8 seconds and not seen 30 minutes later.  At that point  this study was concluded.   ARRHYTHMIAS INDUCED:  Atrial fibrillation was induced, orthodromic and  antidromic tachycardia were not seen although two antidromic echo beats were  noted.   FLUOROSCOPY TIME:  A total of 11 minutes and 53 seconds of fluoroscopy was  utilized in a monoplane configuration.   IMPRESSION:  1. Normal sinus function.  2. Normal atrial function.  3. Normal AV nodal function.  4. Normal His-Purkinje system function.  5. Left lateral accessory pathway which was successfully ablated with the     illumination of antegrade and retrograde conduction over the pathway.  6. Normal ventricular response to programmed stimulation.   SUMMARY:  In conclusion, the results of the electrophysiologic testing  confirmed a left lateral location for the patient's accessory pathway.  Radiofrequency energy applied at the site of earliest atrial activation and  where a presumed slow pathway potential was identified and successfully  interrupted conduction over the pathway eliminating conduction.   RECOMMENDATIONS:  1. Observe overnight although the patient could be potentially discharged     later this evening if things are well     tolerated.  2. Aspirin daily for six weeks.  3. Endocarditis prophylaxis for 12 weeks.                                               Duke Salvia, M.D.    SCK/MEDQ  D:  01/27/2003  T:  01/27/2003  Job:  [id number]   cc:   Donzetta Sprung  [address], [address]  [place]  [place] [address]  Fax: [phone]

## 2010-06-03 NOTE — Discharge Summary (Signed)
NAME:  TAQUAN, BRALLEY                           ACCOUNT NO.:  [id number]   MEDICAL RECORD NO.:  [id number]                   PATIENT TYPE:  OIB   LOCATION:  6523                                 FACILITY:  [facility]   PHYSICIAN:  Duke Salvia, M.D.               DATE OF BIRTH:  1960-08-08   DATE OF ADMISSION:  01/27/2003  DATE OF DISCHARGE:  01/27/2003                                 DISCHARGE SUMMARY   PRIMARY DISCHARGE DIAGNOSIS:  Wolff-Parkinson-White   SECONDARY DIAGNOSES:  1. Dyslipidemia.  2. Supraventricular tachycardia .  3. Chronic obstructive pulmonary disease.   HISTORY OF PRESENT ILLNESS:  This is a 50 year old gentleman with a  past  medical history of Wolff-Parkinson-White syndrome, dyslipidemia, COPD.  He  was admitted on January 15, 2003, with complaint of palpitations, atrial  fibrillation with rapid response.  He has had a negative Cardiolite with an  EF of 55%.  He now presents for Wolff-Parkinson-White syndrome ablation.  He  underwent Wolff-Parkinson-White ablation of the left lateral accessory  pathway, tolerated the procedure well, had no immediate postoperative  complications, and was discharged to home later that evening on his previous  medications.   DISCHARGE MEDICATIONS:  1. Coated aspirin 325 mg daily.  2. Lopressor 25 mg b.i.d.  3. Nicotine patch daily.  4. Antibiotics for prophylaxis for the next three months.  5. Tylenol 1 to 2 tablets q.4-6h. as needed.   ACTIVITY:  No heavy lifting or strenuous activity for four days.  No driving  for two days.   DIET:  Low-fat, low salt, low-cholesterol diet.   FOLLOW UP:  He was to call if he developed a lump or any drainage in his  groin.      Chinita Pester, C.R.N.P. [facility]                 Duke Salvia, M.D.    DS/MEDQ  D:  01/27/2003  T:  01/27/2003  Job:  [id number]   cc:   Jonelle Sidle, M.D. [facility]   Donzetta Sprung, M.D.  [facility], [place]

## 2010-06-23 ENCOUNTER — Other Ambulatory Visit: Payer: Self-pay | Admitting: *Deleted

## 2010-06-23 MED ORDER — ROSUVASTATIN CALCIUM 40 MG PO TABS: 40.0000 mg | ORAL_TABLET | Freq: Every day | ORAL | Status: DC

## 2010-07-21 ENCOUNTER — Encounter: Payer: Self-pay | Admitting: Cardiology

## 2010-08-10 ENCOUNTER — Other Ambulatory Visit: Payer: Self-pay | Admitting: Cardiology

## 2010-08-11 NOTE — Telephone Encounter (Signed)
[facility] patient 

## 2010-08-12 ENCOUNTER — Telehealth: Payer: Self-pay | Admitting: *Deleted

## 2010-08-12 DIAGNOSIS — Z79899 Other long term (current) drug therapy: Secondary | ICD-10-CM

## 2010-08-12 DIAGNOSIS — E785 Hyperlipidemia, unspecified: Secondary | ICD-10-CM

## 2010-08-12 NOTE — Telephone Encounter (Signed)
Pt's wife, Tamela Oddi, left message stating pt is scheduled for f/u appt with Dr. Diona Browner next month and needs labs before appt.   Left message to notify pt that lab order will be faxed to the [facility] and pt can have fasting lab work done before appt.

## 2010-09-09 ENCOUNTER — Other Ambulatory Visit: Payer: Self-pay | Admitting: Cardiology

## 2010-09-26 ENCOUNTER — Encounter: Payer: Self-pay | Admitting: Cardiology

## 2010-09-29 ENCOUNTER — Encounter: Payer: Self-pay | Admitting: Cardiology

## 2010-09-29 ENCOUNTER — Ambulatory Visit: Payer: Self-pay | Admitting: Cardiology

## 2010-09-29 ENCOUNTER — Ambulatory Visit (INDEPENDENT_AMBULATORY_CARE_PROVIDER_SITE_OTHER): Payer: BC Managed Care – PPO | Admitting: Cardiology

## 2010-09-29 DIAGNOSIS — I2510 Atherosclerotic heart disease of native coronary artery without angina pectoris: Secondary | ICD-10-CM

## 2010-09-29 DIAGNOSIS — I456 Pre-excitation syndrome: Secondary | ICD-10-CM

## 2010-09-29 DIAGNOSIS — E785 Hyperlipidemia, unspecified: Secondary | ICD-10-CM

## 2010-09-29 DIAGNOSIS — F17200 Nicotine dependence, unspecified, uncomplicated: Secondary | ICD-10-CM

## 2010-09-29 NOTE — Assessment & Plan Note (Signed)
quiescent on current medication regimen. Decrease aspirin to 81 mg daily, and continue Plavix indefinitely. Of note, patient does have intermittent gingival bleeding, but no other obvious bleeding. I also advised him to hold Plavix for 5 days, if he needs to undergo oral surgery. He is to then resume this at the previous dose, once cleared to do so. Will reassess clinical status in 6 months.

## 2010-09-29 NOTE — Progress Notes (Signed)
HPI: patient presents for 6 month followup.  He denies any interim development of exertional CP or SOB. He has not resumed smoking, since undergoing coronary artery PCI, 5/11. He notes some intermittent gingival bleeding, but no other overt bleeding. He may need to undergo oral surgery in the near future, and inquired about holding aspirin and/or Plavix.  He continues to work full time at [employer], the old [employer] here in [place], with no associated exertional symptoms.    Allergies not on file  Current Outpatient Prescriptions on File Prior to Visit  Medication Sig Dispense Refill  . aspirin 325 MG tablet Take 325 mg by mouth daily.        Marland Kitchen lisinopril (PRINIVIL,ZESTRIL) 10 MG tablet Take 10 mg by mouth daily.        . metoprolol (LOPRESSOR) 50 MG tablet TAKE ONE TABLET BY MOUTH TWICE DAILY  180 tablet  0  . nitroGLYCERIN (NITROSTAT) 0.4 MG SL tablet Place 0.4 mg under the tongue as directed.        Marland Kitchen PLAVIX 75 MG tablet TAKE 1 TABLET BY MOUTH ONCE A DAY EMERGENCY REFILL FAXED DR  30 tablet  6  . rosuvastatin (CRESTOR) 40 MG tablet Take 1 tablet (40 mg total) by mouth daily.  30 tablet  1    Past Medical History  Diagnosis Date  . Wolff-Parkinson-White (WPW) syndrome     S/P RFA (Dr. Graciela Husbands)  . Hypertension   . CAD (coronary artery disease)     DES circ/OM 5/11, residual 50-70% LAD, LVEF 60%  . Hyperlipidemia     No past surgical history on file.  History   Social History  . Marital Status: Married    Spouse Name: N/A    Number of Children: N/A  . Years of Education: N/A   Occupational History  . Not on file.   Social History Main Topics  . Smoking status: Former Games developer  . Smokeless tobacco: Not on file  . Alcohol Use: Not on file  . Drug Use: Not on file  . Sexually Active: Not on file   Other Topics Concern  . Not on file   Social History Narrative  . No narrative on file    Family History  Problem Relation Age of Onset  . Coronary artery disease  Father   . Coronary artery disease Mother   . Heart attack Father 47  . Heart attack Mother 60    ROS: Negative for exertional chest pain, DOE, orthopnea, PND, lower extremity edema, palpitations, presyncope/syncope, claudication, reflux, hematuria, hematochezia, or melena. Remaining systems reviewed, and are negative.   PHYSICAL EXAM:  There were no vitals taken for this visit.  GENERAL: well-nourished, well-developed; NAD HEENT: NCAT, PERRLA, EOMI; sclera clear; no xanthelasma NECK: palpable bilateral carotid pulses, no bruits; no JVD; no TM LUNGS: CTA bilaterally CARDIAC: RRR (S1, S2); no significant murmurs; no rubs or gallops ABDOMEN: soft, non-tender; intact BS EXTREMETIES: intact distal pulses; no significant peripheral edema SKIN: warm/dry; no obvious rash/lesions MUSCULOSKELETAL: no joint deformity NEURO: no focal deficit; NL affect   EKG:    ASSESSMENT & PLAN:

## 2010-09-29 NOTE — Assessment & Plan Note (Signed)
Patient has not resumed smoking, since his coronary intervention.

## 2010-09-29 NOTE — Assessment & Plan Note (Signed)
quiescent, status post RF ablation

## 2010-09-29 NOTE — Assessment & Plan Note (Signed)
Very well controlled with current LDL 54. Continue current dose of Crestor.

## 2010-09-29 NOTE — Patient Instructions (Signed)
Your physician wants you to follow-up in: 6 months. You will receive a reminder letter in the mail one-two months in advance. If you don't receive a letter, please call our office to schedule the follow-up appointment. Decrease Aspirin to 81 mg daily.

## 2010-09-30 MED ORDER — METOPROLOL TARTRATE 50 MG PO TABS: 50.0000 mg | ORAL_TABLET | Freq: Two times a day (BID) | ORAL | Status: DC

## 2011-02-08 ENCOUNTER — Other Ambulatory Visit: Payer: Self-pay | Admitting: Cardiology

## 2011-02-08 NOTE — Telephone Encounter (Signed)
**Note De-identified Carle Fenech Obfuscation** [place] pt. 

## 2011-03-20 ENCOUNTER — Other Ambulatory Visit: Payer: Self-pay | Admitting: Cardiology

## 2011-03-29 ENCOUNTER — Encounter: Payer: Self-pay | Admitting: Cardiology

## 2011-03-29 ENCOUNTER — Ambulatory Visit (INDEPENDENT_AMBULATORY_CARE_PROVIDER_SITE_OTHER): Payer: BC Managed Care – PPO | Admitting: Cardiology

## 2011-03-29 VITALS — BP 149/92 | HR 71 | Ht 72.0 in | Wt 256.0 lb

## 2011-03-29 DIAGNOSIS — I10 Essential (primary) hypertension: Secondary | ICD-10-CM | POA: Insufficient documentation

## 2011-03-29 DIAGNOSIS — E785 Hyperlipidemia, unspecified: Secondary | ICD-10-CM

## 2011-03-29 DIAGNOSIS — I456 Pre-excitation syndrome: Secondary | ICD-10-CM

## 2011-03-29 DIAGNOSIS — I2510 Atherosclerotic heart disease of native coronary artery without angina pectoris: Secondary | ICD-10-CM

## 2011-03-29 MED ORDER — NITROGLYCERIN 0.4 MG SL SUBL: 0.4000 mg | SUBLINGUAL_TABLET | SUBLINGUAL | Status: DC | PRN

## 2011-03-29 NOTE — Assessment & Plan Note (Signed)
Blood pressure is elevated today. Reviewed medications, also discussed diet and weight loss.

## 2011-03-29 NOTE — Patient Instructions (Signed)
Your physician wants you to follow-up in: 6 months. You will receive a reminder letter in the mail one-two months in advance. If you don't receive a letter, please call our office to schedule the follow-up appointment. Your physician recommends that you continue on your current medications as directed. Please refer to the Current Medication list given to you today. 

## 2011-03-29 NOTE — Assessment & Plan Note (Signed)
Quiescent status post RFA.

## 2011-03-29 NOTE — Assessment & Plan Note (Signed)
This has been well controlled, most recent lipid numbers reviewed above.

## 2011-03-29 NOTE — Progress Notes (Signed)
   Clinical Summary Lawrence [name] is a 51 y.o.male presenting for followup. He was seen in September 2012. Continues to work full time, alternates daytime and nighttime shifts. No regular exercise. His weight has gone. He reports stable dyspnea on exertion, no active angina. ECG is reviewed below.  Labwork from November 2012 showed AST 17, ALT 21, triglycerides 277, cholesterol 151, HDL 31, LDL 65. We reviewed his medications, he reports compliance.  Today we discussed his CAD and prior stent, also residual disease. Plan will be to continue medical therapy and observation for now, likely consider followup stress testing around time of his next visit.  No Known Allergies  Current Outpatient Prescriptions  Medication Sig Dispense Refill  . aspirin 81 MG tablet Take 81 mg by mouth daily.        . clopidogrel (PLAVIX) 75 MG tablet TAKE 1 TABLET BY MOUTH ONCE A DAY EMERGENCY REFILL FAXED DR  30 tablet  6  . CRESTOR 40 MG tablet TAKE 1 TABLET (40 MG TOTAL) BY MOUTH DAILY. EMERGENCY REFILL FAXED DR  30 tablet  6  . lisinopril (PRINIVIL,ZESTRIL) 10 MG tablet Take 10 mg by mouth daily.        . metoprolol (LOPRESSOR) 50 MG tablet Take 1 tablet (50 mg total) by mouth 2 (two) times daily.  180 tablet  3  . nitroGLYCERIN (NITROSTAT) 0.4 MG SL tablet Place 1 tablet (0.4 mg total) under the tongue every 5 (five) minutes as needed.  25 tablet  3    Past Medical History  Diagnosis Date  . Wolff-Parkinson-White (WPW) syndrome     Status post  RFA (Dr. Graciela Husbands)  . Hypertension   . CAD (coronary artery disease)     DES circ/OM 5/11, residual 50-70% LAD, LVEF 60%  . Hyperlipidemia     Social History Mr. Byas reports that he quit smoking about 22 months ago. His smoking use included Cigarettes. He has a 75 pack-year smoking history. He has never used smokeless tobacco. Mr. Zidek has no alcohol history on file.  Review of Systems No active bleeding problems. No palpitations. No orthopnea. Otherwise negative  except as outlined.  Physical Examination Filed Vitals:   03/29/11 0904  BP: 149/92  Pulse: 71   Obese male in no acute distress. HEENT: Conjunctiva and lids normal, oropharynx clear. Neck: Supple, no elevated JVP or carotid bruits, no thyromegaly. Lungs: Clear to auscultation, nonlabored breathing at rest. Cardiac: Regular rate and rhythm, no S3 or significant systolic murmur, no pericardial rub. Abdomen: Soft, nontender, protuberant, bowel sounds present, no guarding or rebound. Extremities: No pitting edema, distal pulses 2+. Skin: Warm and dry. Musculoskeletal: No kyphosis. Neuropsychiatric: Alert and oriented x3, affect grossly appropriate.   ECG Sinus rhythm at 67 with PR interval 212 ms.   Problem List and Plan

## 2011-03-29 NOTE — Assessment & Plan Note (Signed)
Symptomatically stable on medical therapy. ECG reviewed. Discussed diet and basic exercise. Followup in 6 months. We will likely discuss stress testing at that time.

## 2011-04-03 ENCOUNTER — Other Ambulatory Visit: Payer: Self-pay | Admitting: Cardiology

## 2011-09-06 ENCOUNTER — Other Ambulatory Visit: Payer: Self-pay | Admitting: Cardiology

## 2011-10-11 ENCOUNTER — Encounter: Payer: Self-pay | Admitting: Cardiology

## 2011-10-11 ENCOUNTER — Ambulatory Visit (INDEPENDENT_AMBULATORY_CARE_PROVIDER_SITE_OTHER): Payer: BC Managed Care – PPO | Admitting: Cardiology

## 2011-10-11 VITALS — BP 126/75 | HR 74 | Ht 72.0 in | Wt 262.0 lb

## 2011-10-11 DIAGNOSIS — I456 Pre-excitation syndrome: Secondary | ICD-10-CM

## 2011-10-11 DIAGNOSIS — E785 Hyperlipidemia, unspecified: Secondary | ICD-10-CM

## 2011-10-11 DIAGNOSIS — I10 Essential (primary) hypertension: Secondary | ICD-10-CM

## 2011-10-11 DIAGNOSIS — I2510 Atherosclerotic heart disease of native coronary artery without angina pectoris: Secondary | ICD-10-CM

## 2011-10-11 MED ORDER — LISINOPRIL-HYDROCHLOROTHIAZIDE 10-12.5 MG PO TABS: 1.0000 | ORAL_TABLET | Freq: Every day | ORAL | Status: DC

## 2011-10-11 NOTE — Assessment & Plan Note (Signed)
Blood pressure looks fairly good today. We are changing his lisinopril to lisinopril HCTZ 10/12.5 mg daily to address some of his mild edema symptoms as well.

## 2011-10-11 NOTE — Assessment & Plan Note (Signed)
Continue statin therapy.

## 2011-10-11 NOTE — Progress Notes (Signed)
   Clinical Summary Lawrence [name] is a 51 y.o.male presenting for followup. He was seen in March. He states that he has been feeling well, no angina or breathlessness. Reports compliance with his medications.  His ECG is normal today. We discussed a possible followup stress test, although decided to proceed at a later date since he has been clinically stable. Refills provided for Plavix.  He does feel some ankle and hand edema, asked about a low-dose diuretic. We discussed a combination antihypertensive with his lisinopril.   No Known Allergies  Current Outpatient Prescriptions  Medication Sig Dispense Refill  . aspirin 81 MG tablet Take 81 mg by mouth daily.        . clopidogrel (PLAVIX) 75 MG tablet TAKE 1 TABLET BY MOUTH ONCE A DAY  30 tablet  11  . CRESTOR 40 MG tablet TAKE 1 TABLET (40 MG TOTAL) BY MOUTH DAILY. EMERGENCY REFILL FAXED DR  30 tablet  6  . lisinopril (PRINIVIL,ZESTRIL) 10 MG tablet TAKE ONE TABLET BY MOUTH EVERY DAY  90 tablet  3  . metoprolol (LOPRESSOR) 50 MG tablet Take 1 tablet (50 mg total) by mouth 2 (two) times daily.  180 tablet  3  . nitroGLYCERIN (NITROSTAT) 0.4 MG SL tablet Place 1 tablet (0.4 mg total) under the tongue every 5 (five) minutes as needed.  25 tablet  3    Past Medical History  Diagnosis Date  . Wolff-Parkinson-White (WPW) syndrome     Status post  RFA (Dr. Graciela Husbands)  . Hypertension   . CAD (coronary artery disease)     DES circ/OM 5/11, residual 50-70% LAD, LVEF 60%  . Hyperlipidemia     Social History Lawrence [name] reports that he quit smoking about 2 years ago. His smoking use included Cigarettes. He has a 75 pack-year smoking history. He has never used smokeless tobacco. Lawrence [name] has no alcohol history on file.  Review of Systems States he has had some bleeding of his gums, also nosebleeds. This is intermittent. No tooth pain. No headaches. Otherwise negative.  Physical Examination Filed Vitals:   10/11/11 1436  BP: 126/75  Pulse: 74    Filed Weights   10/11/11 1436  Weight: 262 lb (118.842 kg)    HEENT: Conjunctiva and lids normal, oropharynx clear.  Neck: Supple, no elevated JVP or carotid bruits, no thyromegaly.  Lungs: Clear to auscultation, nonlabored breathing at rest.  Cardiac: Regular rate and rhythm, no S3 or significant systolic murmur, no pericardial rub.  Abdomen: Soft, nontender, protuberant, bowel sounds present, no guarding or rebound.  Extremities: No pitting edema, distal pulses 2+.  Skin: Warm and dry.  Musculoskeletal: No kyphosis.  Neuropsychiatric: Alert and oriented x3, affect grossly appropriate.    Problem List and Plan   CORONARY ATHEROSCLEROSIS NATIVE CORONARY ARTERY He is doing well clinically at this time. We have decided to hold off on stress testing. His ECG is normal. Continue medical therapy including DAPT, although if he continues to have problems with nosebleeds or gum bleeding, we may need to stop Plavix. Followup arranged.  Essential hypertension, benign Blood pressure looks fairly good today. We are changing his lisinopril to lisinopril HCTZ 10/12.5 mg daily to address some of his mild edema symptoms as well.  HYPERLIPIDEMIA Continue statin therapy.  WOLFF (WOLFE)-PARKINSON-WHITE (WPW) SYNDROME Quiescent.    Jonelle Sidle, M.D., F.A.C.C.

## 2011-10-11 NOTE — Assessment & Plan Note (Signed)
Quiescent

## 2011-10-11 NOTE — Patient Instructions (Addendum)
Your physician recommends that you schedule a follow-up appointment in: 6 months. You will receive a reminder letter in the mail in about 4 months reminding you to call and schedule your appointment. If you don't receive this letter, please contact our office.  Your physician has recommended you make the following change in your medication: Finish your current supply of lisinopril and then start your new prescription of lisinopril/hct 10/12.5 mg daily. Your new prescription has been sent to your pharmacy. All other medication will remain the same.

## 2011-10-11 NOTE — Assessment & Plan Note (Signed)
He is doing well clinically at this time. We have decided to hold off on stress testing. His ECG is normal. Continue medical therapy including DAPT, although if he continues to have problems with nosebleeds or gum bleeding, we may need to stop Plavix. Followup arranged.

## 2011-12-03 ENCOUNTER — Other Ambulatory Visit: Payer: Self-pay | Admitting: Cardiology

## 2011-12-04 NOTE — Telephone Encounter (Signed)
rx sent to pharmacy by e-script Per noted pt has recall to advise about follow up apt

## 2012-01-12 ENCOUNTER — Other Ambulatory Visit: Payer: Self-pay | Admitting: Cardiology

## 2012-01-12 MED ORDER — LISINOPRIL-HYDROCHLOROTHIAZIDE 10-12.5 MG PO TABS: 1.0000 | ORAL_TABLET | Freq: Every day | ORAL | Status: DC

## 2012-04-06 ENCOUNTER — Other Ambulatory Visit: Payer: Self-pay | Admitting: Cardiology

## 2012-07-04 ENCOUNTER — Other Ambulatory Visit: Payer: Self-pay | Admitting: *Deleted

## 2012-07-04 MED ORDER — LISINOPRIL-HYDROCHLOROTHIAZIDE 10-12.5 MG PO TABS: 1.0000 | ORAL_TABLET | Freq: Every day | ORAL | Status: AC

## 2012-08-30 ENCOUNTER — Other Ambulatory Visit: Payer: Self-pay | Admitting: Cardiology

## 2012-08-30 MED ORDER — METOPROLOL TARTRATE 50 MG PO TABS: 50.0000 mg | ORAL_TABLET | Freq: Two times a day (BID) | ORAL | Status: DC

## 2012-10-02 ENCOUNTER — Other Ambulatory Visit: Payer: Self-pay | Admitting: Cardiology

## 2012-10-24 ENCOUNTER — Ambulatory Visit (INDEPENDENT_AMBULATORY_CARE_PROVIDER_SITE_OTHER): Payer: BC Managed Care – PPO | Admitting: Cardiology

## 2012-10-24 ENCOUNTER — Encounter: Payer: Self-pay | Admitting: *Deleted

## 2012-10-24 ENCOUNTER — Encounter: Payer: Self-pay | Admitting: Cardiology

## 2012-10-24 VITALS — BP 104/68 | HR 64 | Ht 72.0 in | Wt 250.4 lb

## 2012-10-24 DIAGNOSIS — I10 Essential (primary) hypertension: Secondary | ICD-10-CM

## 2012-10-24 DIAGNOSIS — R5381 Other malaise: Secondary | ICD-10-CM

## 2012-10-24 DIAGNOSIS — I456 Pre-excitation syndrome: Secondary | ICD-10-CM

## 2012-10-24 DIAGNOSIS — I2510 Atherosclerotic heart disease of native coronary artery without angina pectoris: Secondary | ICD-10-CM

## 2012-10-24 DIAGNOSIS — R5383 Other fatigue: Secondary | ICD-10-CM

## 2012-10-24 DIAGNOSIS — E785 Hyperlipidemia, unspecified: Secondary | ICD-10-CM

## 2012-10-24 NOTE — Assessment & Plan Note (Signed)
Keep follow up with Dr. Reuel Boom. He continues on Crestor.

## 2012-10-24 NOTE — Assessment & Plan Note (Signed)
Blood pressure is normal today. 

## 2012-10-24 NOTE — Assessment & Plan Note (Signed)
He is reporting some fatigue and diaphoresis, no obvious chest pain however. Plan will be to continue current regimen, follow up with a Lexiscan Cardiolite to reassess ischemic burden. He does have moderate LAD disease that has been managed medically. ECG today is normal.

## 2012-10-24 NOTE — Assessment & Plan Note (Signed)
Quiescent status post radiofrequency ablation.

## 2012-10-24 NOTE — Patient Instructions (Signed)
Your physician recommends that you schedule a follow-up appointment in: 1 year. You will receive a reminder letter in the mail in about 10 months reminding you to call and schedule your appointment. If you don't receive this letter, please contact our office. Your physician recommends that you continue on your current medications as directed. Please refer to the Current Medication list given to you today. Your physician has requested that you have a lexiscan myoview. For further information please visit [email or web address]. Please follow instruction sheet, as given.  

## 2012-10-24 NOTE — Progress Notes (Signed)
    Clinical Summary Lawrence [name] is a 52 y.o.male last seen in September 2013. Overall, he reports no major changes, does feel somewhat more tired at times, diaphoretic when he works outdoors. He has not used any nitroglycerin. Reports compliance with his medications, has lipid followup with Lawrence [name] soon.  ECG today shows normal sinus rhythm.  No followup ischemic testing since 2011.   No Known Allergies  Current Outpatient Prescriptions  Medication Sig Dispense Refill  . aspirin 81 MG tablet Take 81 mg by mouth daily.        . clopidogrel (PLAVIX) 75 MG tablet TAKE 1 TABLET BY MOUTH ONCE A DAY  30 tablet  2  . lisinopril-hydrochlorothiazide (PRINZIDE) 10-12.5 MG per tablet Take 1 tablet by mouth daily.  30 tablet  1  . metoprolol (LOPRESSOR) 50 MG tablet Take 1 tablet (50 mg total) by mouth 2 (two) times daily.  180 tablet  0  . rosuvastatin (CRESTOR) 40 MG tablet take 1/2 tablet by mouth daily      . nitroGLYCERIN (NITROSTAT) 0.4 MG SL tablet Place 1 tablet (0.4 mg total) under the tongue every 5 (five) minutes as needed.  25 tablet  3   No current facility-administered medications for this visit.    Past Medical History  Diagnosis Date  . Wolff-Parkinson-White (WPW) syndrome     Status post  RFA (Lawrence [name])  . Hypertension   . CAD (coronary artery disease)     DES circ/OM 5/11, residual 50-70% LAD, LVEF 60%  . Hyperlipidemia     Social History Lawrence [name] reports that he quit smoking about 3 years ago. His smoking use included Cigarettes. He has a 75 pack-year smoking history. He has never used smokeless tobacco. Lawrence [name] has no alcohol history on file.  Review of Systems No palpitations. Occasional indigestion. Arthritic pains. Otherwise negative.  Physical Examination Filed Vitals:   10/24/12 0857  BP: 104/68  Pulse: 64   Filed Weights   10/24/12 0857  Weight: 250 lb 6.4 oz (113.581 kg)   Comfortable at rest. HEENT: Conjunctiva and lids normal, oropharynx  clear.  Neck: Supple, no elevated JVP or carotid bruits, no thyromegaly.  Lungs: Clear to auscultation, nonlabored breathing at rest.  Cardiac: Regular rate and rhythm, no S3 or significant systolic murmur, no pericardial rub.  Abdomen: Soft, nontender, protuberant, bowel sounds present, no guarding or rebound.  Extremities: No pitting edema, distal pulses 2+.  Skin: Warm and dry.  Musculoskeletal: No kyphosis.  Neuropsychiatric: Alert and oriented x3, affect grossly appropriate.   Problem List and Plan   CORONARY ATHEROSCLEROSIS NATIVE CORONARY ARTERY He is reporting some fatigue and diaphoresis, no obvious chest pain however. Plan will be to continue current regimen, follow up with a Lexiscan Cardiolite to reassess ischemic burden. He does have moderate LAD disease that has been managed medically. ECG today is normal.  Essential hypertension, benign Blood pressure is normal today.  HYPERLIPIDEMIA Keep follow up with Lawrence [name]. He continues on Crestor.  WOLFF (WOLFE)-PARKINSON-WHITE (WPW) SYNDROME Quiescent status post radiofrequency ablation.    Lawrence [name], M.D., F.A.C.C.

## 2012-11-04 ENCOUNTER — Other Ambulatory Visit (HOSPITAL_COMMUNITY): Payer: BC Managed Care – PPO

## 2012-11-08 ENCOUNTER — Other Ambulatory Visit: Payer: Self-pay | Admitting: Cardiology

## 2012-11-08 DIAGNOSIS — I2510 Atherosclerotic heart disease of native coronary artery without angina pectoris: Secondary | ICD-10-CM

## 2012-11-11 ENCOUNTER — Encounter (HOSPITAL_COMMUNITY)
Admission: RE | Admit: 2012-11-11 | Discharge: 2012-11-11 | Disposition: A | Discharge status: Home / Self Care | Payer: BC Managed Care – PPO | Source: Ambulatory Visit | Attending: Cardiology | Admitting: Cardiology

## 2012-11-11 ENCOUNTER — Encounter (HOSPITAL_COMMUNITY): Payer: Self-pay

## 2012-11-11 DIAGNOSIS — I2510 Atherosclerotic heart disease of native coronary artery without angina pectoris: Secondary | ICD-10-CM

## 2012-11-11 DIAGNOSIS — R5381 Other malaise: Secondary | ICD-10-CM | POA: Insufficient documentation

## 2012-11-11 MED ORDER — SODIUM CHLORIDE 0.9 % IJ SOLN
INTRAMUSCULAR | Status: AC
  Administered 2012-11-11: 10 mL via INTRAVENOUS
  Filled 2012-11-11: qty 10

## 2012-11-11 MED ORDER — REGADENOSON 0.4 MG/5ML IV SOLN
INTRAVENOUS | Status: AC
  Administered 2012-11-11: 0.4 mg via INTRAVENOUS
  Filled 2012-11-11: qty 5

## 2012-11-11 MED ORDER — TECHNETIUM TC 99M SESTAMIBI - CARDIOLITE
10.0000 | Freq: Once | INTRAVENOUS | Status: AC | PRN
  Administered 2012-11-11: 10 via INTRAVENOUS

## 2012-11-11 MED ORDER — TECHNETIUM TC 99M SESTAMIBI - CARDIOLITE
30.0000 | Freq: Once | INTRAVENOUS | Status: AC | PRN
  Administered 2012-11-11: 12:00:00 30 via INTRAVENOUS

## 2012-11-11 NOTE — Progress Notes (Signed)
Stress Lab Nurses Notes - Lawrence [name]  Lawrence [name] 11/11/2012 Reason for doing test: CAD and Fatigue Type of test: Marlane Hatcher Nurse performing test: Parke Poisson, RN Nuclear Medicine Tech: Lyndel Pleasure Echo Tech: Not Applicable MD performing test: Purvis Sheffield / Joni Reining NP Family MD: Daneil Test explained and consent signed: yes IV started: 22g jelco, Saline lock flushed, No redness or edema and Saline lock started in radiology Symptoms: SOB Treatment/Intervention: None Reason test stopped: protocol completed After recovery IV was: Discontinued via X-ray tech and No redness or edema Patient to return to Nuc. Med at : 12:30 Patient discharged: Home Patient's Condition upon discharge was: stable Comments: During test BP 109/66 & HR 76.  Recovery BP 94/68 & HR 68.  Symptoms resolved in recovery. Erskine Speed T

## 2012-11-12 ENCOUNTER — Telehealth: Payer: Self-pay | Admitting: Cardiology

## 2012-11-12 NOTE — Telephone Encounter (Signed)
LM for pt to returncall

## 2012-11-12 NOTE — Telephone Encounter (Signed)
Message copied by Burnice Logan on Tue Nov 12, 2012 11:50 AM ------      Message from: Jonelle Sidle      Created: Tue Nov 12, 2012  8:37 AM       Reviewed report. No significant ischemia, overall low risk study with normal LVEF 67%. Would continue medical therapy as long as he is symptomatically stable. Keep routine followup. ------

## 2012-11-14 ENCOUNTER — Encounter: Payer: Self-pay | Admitting: *Deleted

## 2012-11-29 ENCOUNTER — Other Ambulatory Visit: Payer: Self-pay | Admitting: Cardiology

## 2012-11-29 MED ORDER — METOPROLOL TARTRATE 50 MG PO TABS: 50.0000 mg | ORAL_TABLET | Freq: Two times a day (BID) | ORAL | Status: DC

## 2012-12-31 ENCOUNTER — Other Ambulatory Visit: Payer: Self-pay | Admitting: Cardiology

## 2013-04-21 ENCOUNTER — Other Ambulatory Visit: Payer: Self-pay | Admitting: Cardiology

## 2013-09-12 ENCOUNTER — Other Ambulatory Visit: Payer: Self-pay | Admitting: Cardiology

## 2013-09-12 ENCOUNTER — Other Ambulatory Visit: Payer: Self-pay | Admitting: *Deleted

## 2013-09-12 MED ORDER — CLOPIDOGREL BISULFATE 75 MG PO TABS: 75.0000 mg | ORAL_TABLET | Freq: Every day | ORAL | Status: DC

## 2013-11-28 ENCOUNTER — Other Ambulatory Visit: Payer: Self-pay | Admitting: *Deleted

## 2013-11-28 MED ORDER — METOPROLOL TARTRATE 50 MG PO TABS: 50.0000 mg | ORAL_TABLET | Freq: Two times a day (BID) | ORAL | Status: DC

## 2013-12-24 ENCOUNTER — Encounter: Payer: Self-pay | Admitting: Cardiology

## 2013-12-24 ENCOUNTER — Other Ambulatory Visit: Payer: Self-pay | Admitting: *Deleted

## 2013-12-24 ENCOUNTER — Ambulatory Visit (INDEPENDENT_AMBULATORY_CARE_PROVIDER_SITE_OTHER): Payer: BC Managed Care – PPO | Admitting: Cardiology

## 2013-12-24 VITALS — BP 103/67 | HR 71 | Ht 72.0 in | Wt 235.0 lb

## 2013-12-24 DIAGNOSIS — I10 Essential (primary) hypertension: Secondary | ICD-10-CM

## 2013-12-24 DIAGNOSIS — E782 Mixed hyperlipidemia: Secondary | ICD-10-CM

## 2013-12-24 DIAGNOSIS — Z136 Encounter for screening for cardiovascular disorders: Secondary | ICD-10-CM

## 2013-12-24 DIAGNOSIS — I2510 Atherosclerotic heart disease of native coronary artery without angina pectoris: Secondary | ICD-10-CM

## 2013-12-24 MED ORDER — NITROGLYCERIN 0.4 MG SL SUBL: 0.4000 mg | SUBLINGUAL_TABLET | SUBLINGUAL | Status: DC | PRN

## 2013-12-24 NOTE — Assessment & Plan Note (Signed)
Symptomatically stable on medical therapy. Refill given for fresh bottle of nitroglycerin. We will continue annual follow-up, sooner if needed.

## 2013-12-24 NOTE — Progress Notes (Signed)
   Reason for visit: CAD  Clinical Summary Lawrence [name] is a 53 y.o.male last seen in October 2014. He presents for a routine visit. Reports no angina symptoms or increasing shortness of breath over NYHA class II. He has an old nitroglycerin bottle, has not needed any. Otherwise continues on aspirin, beta blocker, ACE inhibitor, and statin therapy. ECG today shows normal sinus rhythm. Follow-up Lexiscan Cardiolite in October 2014 showed no evidence of scar or ischemia with LVEF 67%.  He has had some episodes of leg cramping, not entirely clear whether it is related to Crestor. Also states that he has been urinating more frequently and his blood sugars have been higher, he follows with Dr. Reuel Boomaniel.   No Known Allergies  Current Outpatient Prescriptions  Medication Sig Dispense Refill  . aspirin 81 MG tablet Take 81 mg by mouth daily.      . clopidogrel (PLAVIX) 75 MG tablet Take 1 tablet (75 mg total) by mouth daily. 30 tablet 6  . lisinopril-hydrochlorothiazide (PRINZIDE) 10-12.5 MG per tablet Take 1 tablet by mouth daily. 30 tablet 1  . metoprolol (LOPRESSOR) 50 MG tablet Take 1 tablet (50 mg total) by mouth 2 (two) times daily. 180 tablet 0  . nitroGLYCERIN (NITROSTAT) 0.4 MG SL tablet Place 1 tablet (0.4 mg total) under the tongue every 5 (five) minutes as needed. 25 tablet 3  . rosuvastatin (CRESTOR) 40 MG tablet take 1/2 tablet by mouth daily     No current facility-administered medications for this visit.    Past Medical History  Diagnosis Date  . Wolff-Parkinson-White (WPW) syndrome     Status post  RFA (Dr. Graciela HusbandsKlein)  . Hypertension   . CAD (coronary artery disease)     DES circ/OM 5/11, residual 50-70% LAD, LVEF 60%  . Hyperlipidemia     Social History Lawrence [name] reports that he quit smoking about 4 years ago. His smoking use included Cigarettes. He started smoking about 31 years ago. He has a 75 pack-year smoking history. He has never used smokeless tobacco. Lawrence [name] reports  that he does not drink alcohol.  Review of Systems Complete review of systems negative except as otherwise outlined in the clinical summary.  Physical Examination Filed Vitals:   12/24/13 1529  BP: 103/67  Pulse: 71   Filed Weights   12/24/13 1529  Weight: 235 lb (106.595 kg)    Comfortable at rest. HEENT: Conjunctiva and lids normal, oropharynx clear.  Neck: Supple, no elevated JVP or carotid bruits, no thyromegaly.  Lungs: Clear to auscultation, nonlabored breathing at rest.  Cardiac: Regular rate and rhythm, no S3 or significant systolic murmur, no pericardial rub.  Abdomen: Soft, nontender, protuberant, bowel sounds present, no guarding or rebound.  Extremities: No pitting edema, distal pulses 2+.  Skin: Warm and dry.  Musculoskeletal: No kyphosis.  Neuropsychiatric: Alert and oriented x3, affect grossly appropriate.   Problem List and Plan   CORONARY ATHEROSCLEROSIS NATIVE CORONARY ARTERY Symptomatically stable on medical therapy. Refill given for fresh bottle of nitroglycerin. We will continue annual follow-up, sooner if needed.  Essential hypertension, benign Blood pressure is normal today.  Mixed hyperlipidemia He continues on Crestor 20 g daily. I asked him to try taking it every other day if his cramping persists. Lipids have been followed by Dr. Reuel Boomaniel. We will request his most recent lab work.    Jonelle SidleSamuel G. Cederick Broadnax, M.D., F.A.C.C.

## 2013-12-24 NOTE — Patient Instructions (Signed)

## 2013-12-24 NOTE — Assessment & Plan Note (Signed)
Blood pressure is normal today. 

## 2013-12-24 NOTE — Assessment & Plan Note (Signed)
He continues on Crestor 20 g daily. I asked him to try taking it every other day if his cramping persists. Lipids have been followed by Dr. Reuel Boomaniel. We will request his most recent lab work.

## 2014-02-26 ENCOUNTER — Telehealth: Payer: Self-pay | Admitting: Cardiology

## 2014-02-26 MED ORDER — METOPROLOL TARTRATE 50 MG PO TABS: 50.0000 mg | ORAL_TABLET | Freq: Two times a day (BID) | ORAL | Status: DC

## 2014-02-26 NOTE — Telephone Encounter (Signed)
Requesting refill on  metoprolol (LOPRESSOR) 50 MG tablet  Medication     Please call [facility], [place]

## 2014-05-02 ENCOUNTER — Other Ambulatory Visit: Payer: Self-pay | Admitting: Cardiology

## 2014-10-29 ENCOUNTER — Other Ambulatory Visit: Payer: Self-pay | Admitting: Cardiology

## 2014-12-28 ENCOUNTER — Ambulatory Visit (INDEPENDENT_AMBULATORY_CARE_PROVIDER_SITE_OTHER): Payer: BLUE CROSS/BLUE SHIELD | Admitting: Cardiology

## 2014-12-28 ENCOUNTER — Encounter: Payer: Self-pay | Admitting: Cardiology

## 2014-12-28 ENCOUNTER — Encounter: Payer: Self-pay | Admitting: *Deleted

## 2014-12-28 VITALS — BP 126/79 | HR 70 | Ht 72.0 in | Wt 214.4 lb

## 2014-12-28 DIAGNOSIS — I456 Pre-excitation syndrome: Secondary | ICD-10-CM | POA: Diagnosis not present

## 2014-12-28 DIAGNOSIS — E782 Mixed hyperlipidemia: Secondary | ICD-10-CM

## 2014-12-28 DIAGNOSIS — I10 Essential (primary) hypertension: Secondary | ICD-10-CM | POA: Diagnosis not present

## 2014-12-28 DIAGNOSIS — I2510 Atherosclerotic heart disease of native coronary artery without angina pectoris: Secondary | ICD-10-CM

## 2014-12-28 NOTE — Patient Instructions (Signed)

## 2014-12-28 NOTE — Progress Notes (Signed)
Cardiology Office Note  Date: 12/28/2014   ID: TEHRAN RABENOLD, DOB 1960/03/11, MRN [id number]  PCP: Donzetta Sprung, MD  Primary Cardiologist: Nona Dell, MD   Chief Complaint  Patient presents with  . Coronary Artery Disease  . WPW    History of Present Illness: CHRISTROPHER [name] is a 54 y.o. male last seen in December 2015. He presents for a routine follow-up visit. 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 He has worked swing shifts, but is now on daytime and seems to be doing much better. He does not report any angina symptoms or nitroglycerin use, has NYHA class 1-2 dyspnea.  We reviewed his medications which are outlined below. I note that he is now on metformin. He states that he has been trying to lose some weight to address his diabetes mellitus - 20 pounds since last year. He continues to follow with Dr. Reuel Boom and just recently had lab work which we are requesting.  ECG today was normal. He does not report any palpitations, dizziness, or syncope.  He remains on Crestor, has a history of high triglycerides and low HDL.  He has continued on DAPT long-term, no bleeding problems. Last ischemic workup was within the last 2 years.  Past Medical History  Diagnosis Date  . Wolff-Parkinson-White (WPW) syndrome     Status post  RFA (Dr. Graciela Husbands)  . Hypertension   . CAD (coronary artery disease)     DES circ/OM 5/11, residual 50-70% LAD, LVEF 60%  . Hyperlipidemia     History reviewed. No pertinent past surgical history.  Current Outpatient Prescriptions  Medication Sig Dispense Refill  . aspirin 81 MG tablet Take 81 mg by mouth daily.      . clopidogrel (PLAVIX) 75 MG tablet TAKE 1 TABLET BY MOUTH EVERY DAY 30 tablet 3  . lisinopril-hydrochlorothiazide (PRINZIDE) 10-12.5 MG per tablet Take 1 tablet by mouth daily. 30 tablet 1  . loratadine (CLARITIN) 10 MG tablet Take 10 mg by mouth daily as needed for allergies.    .  metFORMIN (GLUCOPHAGE-XR) 500 MG 24 hr tablet Take 1 tablet by mouth 3 (three) times daily.  0  . metoprolol (LOPRESSOR) 50 MG tablet Take 1 tablet (50 mg total) by mouth 2 (two) times daily. 180 tablet 3  . nitroGLYCERIN (NITROSTAT) 0.4 MG SL tablet Place 1 tablet (0.4 mg total) under the tongue every 5 (five) minutes x 3 doses as needed. 25 tablet 3  . rosuvastatin (CRESTOR) 40 MG tablet take 1/2 tablet by mouth daily     No current facility-administered medications for this visit.   Allergies:  Review of patient's allergies indicates no known allergies.   Social History: The patient  reports that he quit smoking about 5 years ago. His smoking use included Cigarettes. He started smoking about 32 years ago. He has a 75 pack-year smoking history. He has never used smokeless tobacco. He reports that he does not drink alcohol or use illicit drugs.    ROS:  Please see the history of present illness. Otherwise, complete review of systems is positive for none.  All other systems are reviewed and negative.   Physical Exam: VS:  BP 126/79 mmHg  Pulse 70  Ht 6' (1.829 m)  Wt 214 lb 6.4 oz (97.251 kg)  BMI 29.07 kg/m2  SpO2 99%, BMI Body mass index is 29.07 kg/(m^2).  Wt Readings from Last 3 Encounters:  12/28/14 214 lb 6.4  oz (97.251 kg)  12/24/13 235 lb (106.595 kg)  10/24/12 250 lb 6.4 oz (113.581 kg)    Gen.: Appears comfortable at rest. HEENT: Conjunctiva and lids normal, oropharynx clear.  Neck: Supple, no elevated JVP or carotid bruits, no thyromegaly.  Lungs: Clear to auscultation, nonlabored breathing at rest.  Cardiac: Regular rate and rhythm, no S3 or significant systolic murmur, no pericardial rub.  Abdomen: Soft, nontender, bowel sounds present, no guarding or rebound.  Extremities: No pitting edema, distal pulses 2+.  Skin: Warm and dry.  Musculoskeletal: No kyphosis.  Neuropsychiatric: Alert and oriented x3, affect grossly appropriate.  ECG: ECG is ordered  today.  Recent Labwork:  November 2015: BUN 14, creatinine 0.9, potassium 4.6, AST 16, ALT 17, cholesterol 147, triglycerides 814, HDL 16  Other Studies Reviewed Today:  Lexiscan Cardiolite in October 2014 showed no evidence of scar or ischemia with LVEF 67%.  Assessment and Plan:  1. CAD status post DES to the circumflex/OM in May 2011. He does not report any angina symptoms on medical therapy and had a reassuring ischemic evaluation within the last 2 years. ECG today is normal. Continue regular activity and observation.  2. History of mixed hyperlipidemia as outlined above. He continues on high-dose Crestor. Requesting most recent lab work from Dr. Reuel Boomaniel.  3. History of WPW status post ablation by Dr. Graciela HusbandsKlein, continues to do very well without any recurrence.  4. Essential hypertension, blood pressure control is good today. No changes made. He continues on Prinzide and Lopressor.  Current medicines were reviewed with the patient today.   Orders Placed This Encounter  Procedures  . EKG 12-Lead    Disposition: FU with me in 1 year.   Signed, Jonelle SidleSamuel G. Latysha Thackston, MD, [facility] 12/28/2014 8:52 AM    [facility] [address], [place], [place] [address] Phone: [phone]; Fax: [phone]) [phone]

## 2015-02-22 ENCOUNTER — Other Ambulatory Visit: Payer: Self-pay | Admitting: Cardiology

## 2015-05-12 ENCOUNTER — Other Ambulatory Visit: Payer: Self-pay | Admitting: Cardiology

## 2015-05-12 NOTE — Telephone Encounter (Signed)
Patient takes 1/2 tablet daily per last office note in December 2016

## 2015-06-01 DIAGNOSIS — D1721 Benign lipomatous neoplasm of skin and subcutaneous tissue of right arm: Secondary | ICD-10-CM | POA: Diagnosis not present

## 2015-08-09 NOTE — Progress Notes (Signed)
This encounter was created in error - please disregard.

## 2015-08-13 DIAGNOSIS — E782 Mixed hyperlipidemia: Secondary | ICD-10-CM | POA: Diagnosis not present

## 2015-08-13 DIAGNOSIS — E1165 Type 2 diabetes mellitus with hyperglycemia: Secondary | ICD-10-CM | POA: Diagnosis not present

## 2015-08-13 DIAGNOSIS — I10 Essential (primary) hypertension: Secondary | ICD-10-CM | POA: Diagnosis not present

## 2015-08-13 DIAGNOSIS — K210 Gastro-esophageal reflux disease with esophagitis: Secondary | ICD-10-CM | POA: Diagnosis not present

## 2015-08-19 DIAGNOSIS — E1165 Type 2 diabetes mellitus with hyperglycemia: Secondary | ICD-10-CM | POA: Diagnosis not present

## 2015-08-19 DIAGNOSIS — I10 Essential (primary) hypertension: Secondary | ICD-10-CM | POA: Diagnosis not present

## 2015-08-19 DIAGNOSIS — I2510 Atherosclerotic heart disease of native coronary artery without angina pectoris: Secondary | ICD-10-CM | POA: Diagnosis not present

## 2015-08-19 DIAGNOSIS — E782 Mixed hyperlipidemia: Secondary | ICD-10-CM | POA: Diagnosis not present

## 2015-11-16 ENCOUNTER — Other Ambulatory Visit: Payer: Self-pay | Admitting: Cardiology

## 2015-11-19 DIAGNOSIS — K219 Gastro-esophageal reflux disease without esophagitis: Secondary | ICD-10-CM | POA: Diagnosis not present

## 2015-11-19 DIAGNOSIS — I10 Essential (primary) hypertension: Secondary | ICD-10-CM | POA: Diagnosis not present

## 2015-11-19 DIAGNOSIS — E782 Mixed hyperlipidemia: Secondary | ICD-10-CM | POA: Diagnosis not present

## 2015-11-19 DIAGNOSIS — E1165 Type 2 diabetes mellitus with hyperglycemia: Secondary | ICD-10-CM | POA: Diagnosis not present

## 2015-11-24 DIAGNOSIS — I10 Essential (primary) hypertension: Secondary | ICD-10-CM | POA: Diagnosis not present

## 2015-11-24 DIAGNOSIS — E782 Mixed hyperlipidemia: Secondary | ICD-10-CM | POA: Diagnosis not present

## 2015-11-24 DIAGNOSIS — I2510 Atherosclerotic heart disease of native coronary artery without angina pectoris: Secondary | ICD-10-CM | POA: Diagnosis not present

## 2015-11-24 DIAGNOSIS — E1165 Type 2 diabetes mellitus with hyperglycemia: Secondary | ICD-10-CM | POA: Diagnosis not present

## 2016-01-11 ENCOUNTER — Ambulatory Visit (INDEPENDENT_AMBULATORY_CARE_PROVIDER_SITE_OTHER): Payer: BLUE CROSS/BLUE SHIELD | Admitting: Cardiology

## 2016-01-11 ENCOUNTER — Encounter: Payer: Self-pay | Admitting: Cardiology

## 2016-01-11 VITALS — BP 106/71 | HR 76 | Ht 72.0 in | Wt 226.0 lb

## 2016-01-11 DIAGNOSIS — I456 Pre-excitation syndrome: Secondary | ICD-10-CM

## 2016-01-11 DIAGNOSIS — I2510 Atherosclerotic heart disease of native coronary artery without angina pectoris: Secondary | ICD-10-CM | POA: Diagnosis not present

## 2016-01-11 DIAGNOSIS — E782 Mixed hyperlipidemia: Secondary | ICD-10-CM | POA: Diagnosis not present

## 2016-01-11 DIAGNOSIS — I10 Essential (primary) hypertension: Secondary | ICD-10-CM | POA: Diagnosis not present

## 2016-01-11 NOTE — Progress Notes (Signed)
Cardiology Office Note  Date: 01/11/2016   ID: Lawrence [name], DOB Sep 27, 1960, MRN [id number]  PCP: Donzetta SprungERRY DANIEL, MD  Primary Cardiologist: Nona DellSamuel Miakoda Mcmillion, MD   Chief Complaint  Patient presents with  . Coronary Artery Disease    History of Present Illness: Lawrence [name] is a 55 y.o. male last seen in December 2016. He presents for a routine follow-up visit. 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 He is not reporting any angina symptoms or nitroglycerin use. NYHA class II dyspnea. He did try to lose some weight with uncontrolled glucose and elevated triglycerides, had gotten down to the 190s. Unfortunately, he gained all his weight back, has been less active. He continues to follow with Dr. Reuel Boomaniel.  Last stress testing was in 2014 as outlined below. I reviewed his ECG today which shows sinus rhythm.  We discussed his medications. He has preferred to stay on both aspirin and Plavix long-term. No bleeding problems.  He reports compliance with Crestor. I talked with him about the importance of getting his weight back down and glucose under control.  Past Medical History:  Diagnosis Date  . CAD (coronary artery disease)    DES circ/OM 5/11, residual 50-70% LAD, LVEF 60%  . Hyperlipidemia   . Hypertension   . Wolff-Parkinson-White (WPW) syndrome    Status post  RFA (Dr. Graciela HusbandsKlein)    History reviewed. No pertinent surgical history.  Current Outpatient Prescriptions  Medication Sig Dispense Refill  . aspirin 81 MG tablet Take 81 mg by mouth daily.      . clopidogrel (PLAVIX) 75 MG tablet TAKE 1 TABLET BY MOUTH EVERY DAY 30 tablet 6  . lisinopril-hydrochlorothiazide (PRINZIDE) 10-12.5 MG per tablet Take 1 tablet by mouth daily. 30 tablet 1  . loratadine (CLARITIN) 10 MG tablet Take 10 mg by mouth daily as needed for allergies.    . metFORMIN (GLUCOPHAGE-XR) 500 MG 24 hr tablet Take 1 tablet by mouth 3 (three) times daily.  0  . metoprolol (LOPRESSOR) 50 MG tablet TAKE  ONE TABLET BY MOUTH TWICE DAILY 180 tablet 3  . nitroGLYCERIN (NITROSTAT) 0.4 MG SL tablet Place 1 tablet (0.4 mg total) under the tongue every 5 (five) minutes x 3 doses as needed. 25 tablet 3  . rosuvastatin (CRESTOR) 40 MG tablet take 1/2 tablet by mouth daily    . rosuvastatin (CRESTOR) 40 MG tablet TAKE 1 TABLET BY MOUTH EVERY DAY 30 tablet 6   No current facility-administered medications for this visit.    Allergies:  Patient has no known allergies.   Social History: The patient  reports that he quit smoking about 6 years ago. His smoking use included Cigarettes. He started smoking about 33 years ago. He has a 75.00 pack-year smoking history. He has never used smokeless tobacco. He reports that he does not drink alcohol or use drugs.   ROS:  Please see the history of present illness. Otherwise, complete review of systems is positive for arthritic symptoms.  All other systems are reviewed and negative.   Physical Exam: VS:  BP 106/71   Pulse 76   Ht 6' (1.829 m)   Wt 226 lb (102.5 kg)   SpO2 98%   BMI 30.65 kg/m , BMI Body mass index is 30.65 kg/m.  Wt Readings from Last 3 Encounters:  01/11/16 226 lb (102.5 kg)  12/28/14 214 lb 6.4 oz (97.3 kg)  12/24/13 235 lb (106.6 kg)    Gen.: Obese male, appears comfortable  at rest. HEENT: Conjunctiva and lids normal, oropharynx clear.  Neck: Supple, no elevated JVP or carotid bruits, no thyromegaly.  Lungs: Clear to auscultation, nonlabored breathing at rest.  Cardiac: Regular rate and rhythm, no S3 or significant systolic murmur, no pericardial rub.  Abdomen: Soft, nontender, bowel sounds present, no guarding or rebound.  Extremities: No pitting edema, distal pulses 2+.  Skin: Warm and dry.  Musculoskeletal: No kyphosis.  Neuropsychiatric: Alert and oriented x3, affect grossly appropriate.  ECG: I personally reviewed the tracing from 12/28/2014 which showed normal sinus rhythm.  Recent Labwork:  November 2016:  Cholesterol 157, triglycerides 777, HDL 19, LDL not calculated, BUN 11, creatinine 0.9, potassium 4.4, AST 13, ALT 14, hemoglobin A1c 12.9, TSH 1.28   Other Studies Reviewed Today:  Lexiscan Cardiolite in October 2014 showed no evidence of scar or ischemia with LVEF 67%.  Assessment and Plan:  1. CAD status post DES to the circumflex/OM in 2011 with moderate residual LAD disease. He remains stable without angina on medical therapy. His ECG today is normal. I talked about diet and weight loss. Otherwise continue observation for now.  2. Mixed hyperlipidemia. Most recently he had significantly elevated triglycerides with low HDL. This is complicated by poor glucose control. Discussed diet and weight loss. He will stay on high-dose Crestor. Omega-3 supplements would also be a consideration.  3. Essential hypertension, blood pressure is well controlled.  4. History of WPW status post ablation by Dr. Graciela HusbandsKlein, quiescent.  Current medicines were reviewed with the patient today.   Orders Placed This Encounter  Procedures  . EKG 12-Lead    Disposition: Follow-up in one year.  Signed, Jonelle SidleSamuel G. Verneal Wiers, MD, [facility]FACC 01/11/2016 2:18 PM    [facility] [address], [place], [place] [address] Phone: [phone](336) [phone]; Fax: [phone]) [phone]

## 2016-01-11 NOTE — Patient Instructions (Signed)

## 2016-01-25 DIAGNOSIS — J4521 Mild intermittent asthma with (acute) exacerbation: Secondary | ICD-10-CM | POA: Diagnosis not present

## 2016-01-25 DIAGNOSIS — Z6829 Body mass index (BMI) 29.0-29.9, adult: Secondary | ICD-10-CM | POA: Diagnosis not present

## 2016-02-19 ENCOUNTER — Other Ambulatory Visit: Payer: Self-pay | Admitting: Cardiology

## 2016-03-01 DIAGNOSIS — E782 Mixed hyperlipidemia: Secondary | ICD-10-CM | POA: Diagnosis not present

## 2016-03-01 DIAGNOSIS — I2510 Atherosclerotic heart disease of native coronary artery without angina pectoris: Secondary | ICD-10-CM | POA: Diagnosis not present

## 2016-03-01 DIAGNOSIS — I10 Essential (primary) hypertension: Secondary | ICD-10-CM | POA: Diagnosis not present

## 2016-03-01 DIAGNOSIS — Z0001 Encounter for general adult medical examination with abnormal findings: Secondary | ICD-10-CM | POA: Diagnosis not present

## 2016-03-01 DIAGNOSIS — E1165 Type 2 diabetes mellitus with hyperglycemia: Secondary | ICD-10-CM | POA: Diagnosis not present

## 2016-04-21 DIAGNOSIS — L821 Other seborrheic keratosis: Secondary | ICD-10-CM | POA: Diagnosis not present

## 2016-04-21 DIAGNOSIS — C44529 Squamous cell carcinoma of skin of other part of trunk: Secondary | ICD-10-CM | POA: Diagnosis not present

## 2016-04-21 DIAGNOSIS — L570 Actinic keratosis: Secondary | ICD-10-CM | POA: Diagnosis not present

## 2016-05-20 DIAGNOSIS — S90121A Contusion of right lesser toe(s) without damage to nail, initial encounter: Secondary | ICD-10-CM | POA: Diagnosis not present

## 2016-05-20 DIAGNOSIS — Z6831 Body mass index (BMI) 31.0-31.9, adult: Secondary | ICD-10-CM | POA: Diagnosis not present

## 2016-06-21 DIAGNOSIS — I25111 Atherosclerotic heart disease of native coronary artery with angina pectoris with documented spasm: Secondary | ICD-10-CM | POA: Diagnosis not present

## 2016-06-21 DIAGNOSIS — E1165 Type 2 diabetes mellitus with hyperglycemia: Secondary | ICD-10-CM | POA: Diagnosis not present

## 2016-06-21 DIAGNOSIS — I10 Essential (primary) hypertension: Secondary | ICD-10-CM | POA: Diagnosis not present

## 2016-06-21 DIAGNOSIS — K210 Gastro-esophageal reflux disease with esophagitis: Secondary | ICD-10-CM | POA: Diagnosis not present

## 2016-06-21 DIAGNOSIS — E782 Mixed hyperlipidemia: Secondary | ICD-10-CM | POA: Diagnosis not present

## 2016-06-26 DIAGNOSIS — I10 Essential (primary) hypertension: Secondary | ICD-10-CM | POA: Diagnosis not present

## 2016-06-26 DIAGNOSIS — I2510 Atherosclerotic heart disease of native coronary artery without angina pectoris: Secondary | ICD-10-CM | POA: Diagnosis not present

## 2016-06-26 DIAGNOSIS — E1165 Type 2 diabetes mellitus with hyperglycemia: Secondary | ICD-10-CM | POA: Diagnosis not present

## 2016-06-26 DIAGNOSIS — E782 Mixed hyperlipidemia: Secondary | ICD-10-CM | POA: Diagnosis not present

## 2016-07-20 ENCOUNTER — Other Ambulatory Visit: Payer: Self-pay | Admitting: *Deleted

## 2016-07-20 MED ORDER — ROSUVASTATIN CALCIUM 40 MG PO TABS: 40.0000 mg | ORAL_TABLET | Freq: Every day | ORAL | 3 refills | Status: DC

## 2016-10-17 DIAGNOSIS — Z0001 Encounter for general adult medical examination with abnormal findings: Secondary | ICD-10-CM | POA: Diagnosis not present

## 2016-10-25 DIAGNOSIS — E1165 Type 2 diabetes mellitus with hyperglycemia: Secondary | ICD-10-CM | POA: Diagnosis not present

## 2016-10-25 DIAGNOSIS — I10 Essential (primary) hypertension: Secondary | ICD-10-CM | POA: Diagnosis not present

## 2016-10-25 DIAGNOSIS — I2510 Atherosclerotic heart disease of native coronary artery without angina pectoris: Secondary | ICD-10-CM | POA: Diagnosis not present

## 2016-10-25 DIAGNOSIS — E782 Mixed hyperlipidemia: Secondary | ICD-10-CM | POA: Diagnosis not present

## 2016-11-14 DIAGNOSIS — Z1212 Encounter for screening for malignant neoplasm of rectum: Secondary | ICD-10-CM | POA: Diagnosis not present

## 2016-11-14 DIAGNOSIS — Z1211 Encounter for screening for malignant neoplasm of colon: Secondary | ICD-10-CM | POA: Diagnosis not present

## 2017-01-30 NOTE — Progress Notes (Signed)
Cardiology Office Note  Date: 02/01/2017   ID: FARREN NELLES, DOB July 17, 1960, MRN [id number]  PCP: Richardean Chimera, MD  Primary Cardiologist: Nona Dell, MD   Chief Complaint  Patient presents with  . Coronary Artery Disease    History of Present Illness: Lawrence [name] is a 57 y.o. male last seen in December 2017. He presents for a routine follow-up visit. He does not report any angina symptoms or nitroglycerin use in the last year. Continues to work full time, reports stable NYHA class II dyspnea, no palpitations or syncope.  Last ischemic evaluation was in 2014 as outlined below, overall low risk. We discussed obtaining an updated study around the time of his next visit. I personally reviewed his ECG today which shows sinus rhythm with prolonged PR interval.  I reviewed his medications which are outlined below in stable from a cardiac perspective. He is following lipids on Crestor with Dr. Reuel Boom.  Past Medical History:  Diagnosis Date  . CAD (coronary artery disease)    DES circ/OM 5/11, residual 50-70% LAD, LVEF 60%  . Hyperlipidemia   . Hypertension   . Wolff-Parkinson-White (WPW) syndrome    Status post  RFA (Dr. Graciela Husbands)    History reviewed. No pertinent surgical history.  Current Outpatient Medications  Medication Sig Dispense Refill  . aspirin 81 MG tablet Take 81 mg by mouth daily.      . clopidogrel (PLAVIX) 75 MG tablet TAKE 1 TABLET BY MOUTH EVERY DAY 30 tablet 6  . lisinopril-hydrochlorothiazide (PRINZIDE) 10-12.5 MG per tablet Take 1 tablet by mouth daily. 30 tablet 1  . loratadine (CLARITIN) 10 MG tablet Take 10 mg by mouth daily as needed for allergies.    . metFORMIN (GLUCOPHAGE-XR) 500 MG 24 hr tablet Take 1 tablet by mouth 3 (three) times daily.  0  . metoprolol tartrate (LOPRESSOR) 50 MG tablet Take 1 tablet (50 mg total) by mouth 2 (two) times daily. 180 tablet 3  . nitroGLYCERIN (NITROSTAT) 0.4 MG SL tablet Place 1 tablet (0.4 mg total) under the  tongue every 5 (five) minutes x 3 doses as needed. 25 tablet 3  . rosuvastatin (CRESTOR) 40 MG tablet Take 1 tablet (40 mg total) by mouth daily. 30 tablet 3   No current facility-administered medications for this visit.    Allergies:  Patient has no known allergies.   Social History: The patient  reports that he quit smoking about 7 years ago. His smoking use included cigarettes. He started smoking about 34 years ago. He has a 75.00 pack-year smoking history. he has never used smokeless tobacco. He reports that he does not drink alcohol or use drugs.  ROS:  Please see the history of present illness. Otherwise, complete review of systems is positive for none.  All other systems are reviewed and negative.   Physical Exam: VS:  BP 112/64   Pulse 72   Ht 6' (1.829 m)   Wt 235 lb 9.6 oz (106.9 kg)   SpO2 97%   BMI 31.95 kg/m , BMI Body mass index is 31.95 kg/m.  Wt Readings from Last 3 Encounters:  02/01/17 235 lb 9.6 oz (106.9 kg)  01/11/16 226 lb (102.5 kg)  12/28/14 214 lb 6.4 oz (97.3 kg)    General: Patient appears comfortable at rest. HEENT: Conjunctiva and lids normal, oropharynx clear. Neck: Supple, no elevated JVP or carotid bruits, no thyromegaly. Lungs: Clear to auscultation, nonlabored breathing at rest. Cardiac: Regular rate and rhythm, no S3  or significant systolic murmur, no pericardial rub. Abdomen: Soft, nontender, bowel sounds present. Extremities: No pitting edema, distal pulses 2+. Skin: Warm and dry. Musculoskeletal: No kyphosis. Neuropsychiatric: Alert and oriented x3, affect grossly appropriate.  ECG: I personally reviewed the tracing from 01/11/2016 which showed sinus rhythm.  Recent Labwork:  November 2016: Cholesterol 157, triglycerides 777, HDL 19, LDL not calculated, BUN 11, creatinine 0.9, potassium 4.4, AST 13, ALT 14, hemoglobin A1c 12.9, TSH 1.28   Other Studies Reviewed Today:  Lexiscan Cardiolite in October 2014 showed no evidence of scar or  ischemia with LVEF 67%.  Assessment and Plan:  1. CAD with moderate LAD disease that has been managed medically and previous DES to the circumflex/OM in 2011. Plan to continue medical therapy and observation. We will plan on obtaining a Lexiscan Myoview around the time of his next follow-up visit.  2. Mixed hyperlipidemia, he continues on Crestor. Follow-up lipid panel with Dr. Reuel Boomaniel.  3. Essential hypertension, blood pressure is well controlled today.  4. History of WPW status post ablation. No palpitations or syncope.  Current medicines were reviewed with the patient today.   Orders Placed This Encounter  Procedures  . EKG 12-Lead    Disposition: Follow-up in one year.  Signed, Jonelle SidleSamuel G. Rissie Sculley, MD, [facility]ACC 02/01/2017 2:49 PM    [facility] [address], [place], [place] [address] Phone: [phone](336) [phone]; Fax: [phone]) [phone]

## 2017-02-01 ENCOUNTER — Ambulatory Visit: Payer: BLUE CROSS/BLUE SHIELD | Admitting: Cardiology

## 2017-02-01 ENCOUNTER — Encounter: Payer: Self-pay | Admitting: Cardiology

## 2017-02-01 VITALS — BP 112/64 | HR 72 | Ht 72.0 in | Wt 235.6 lb

## 2017-02-01 DIAGNOSIS — E782 Mixed hyperlipidemia: Secondary | ICD-10-CM

## 2017-02-01 DIAGNOSIS — I2510 Atherosclerotic heart disease of native coronary artery without angina pectoris: Secondary | ICD-10-CM | POA: Diagnosis not present

## 2017-02-01 DIAGNOSIS — I456 Pre-excitation syndrome: Secondary | ICD-10-CM | POA: Diagnosis not present

## 2017-02-01 DIAGNOSIS — I10 Essential (primary) hypertension: Secondary | ICD-10-CM

## 2017-02-01 MED ORDER — METOPROLOL TARTRATE 50 MG PO TABS: 50.0000 mg | ORAL_TABLET | Freq: Two times a day (BID) | ORAL | 3 refills | Status: DC

## 2017-02-01 NOTE — Patient Instructions (Signed)

## 2017-02-23 DIAGNOSIS — Z0000 Encounter for general adult medical examination without abnormal findings: Secondary | ICD-10-CM | POA: Diagnosis not present

## 2017-02-28 DIAGNOSIS — I2510 Atherosclerotic heart disease of native coronary artery without angina pectoris: Secondary | ICD-10-CM | POA: Diagnosis not present

## 2017-02-28 DIAGNOSIS — I10 Essential (primary) hypertension: Secondary | ICD-10-CM | POA: Diagnosis not present

## 2017-02-28 DIAGNOSIS — E1165 Type 2 diabetes mellitus with hyperglycemia: Secondary | ICD-10-CM | POA: Diagnosis not present

## 2017-02-28 DIAGNOSIS — Z0001 Encounter for general adult medical examination with abnormal findings: Secondary | ICD-10-CM | POA: Diagnosis not present

## 2017-02-28 DIAGNOSIS — E782 Mixed hyperlipidemia: Secondary | ICD-10-CM | POA: Diagnosis not present

## 2017-04-12 ENCOUNTER — Other Ambulatory Visit: Payer: Self-pay | Admitting: *Deleted

## 2017-04-12 MED ORDER — ROSUVASTATIN CALCIUM 40 MG PO TABS: 40.0000 mg | ORAL_TABLET | Freq: Every day | ORAL | 3 refills | Status: DC

## 2017-05-29 DIAGNOSIS — L570 Actinic keratosis: Secondary | ICD-10-CM | POA: Diagnosis not present

## 2017-06-16 DIAGNOSIS — Z6830 Body mass index (BMI) 30.0-30.9, adult: Secondary | ICD-10-CM | POA: Diagnosis not present

## 2017-06-16 DIAGNOSIS — S20461A Insect bite (nonvenomous) of right back wall of thorax, initial encounter: Secondary | ICD-10-CM | POA: Diagnosis not present

## 2017-06-29 DIAGNOSIS — E1169 Type 2 diabetes mellitus with other specified complication: Secondary | ICD-10-CM | POA: Diagnosis not present

## 2017-06-29 DIAGNOSIS — I10 Essential (primary) hypertension: Secondary | ICD-10-CM | POA: Diagnosis not present

## 2017-06-29 DIAGNOSIS — J301 Allergic rhinitis due to pollen: Secondary | ICD-10-CM | POA: Diagnosis not present

## 2017-06-29 DIAGNOSIS — E782 Mixed hyperlipidemia: Secondary | ICD-10-CM | POA: Diagnosis not present

## 2017-06-29 DIAGNOSIS — I2510 Atherosclerotic heart disease of native coronary artery without angina pectoris: Secondary | ICD-10-CM | POA: Diagnosis not present

## 2017-10-25 DIAGNOSIS — E782 Mixed hyperlipidemia: Secondary | ICD-10-CM | POA: Diagnosis not present

## 2017-10-25 DIAGNOSIS — E1165 Type 2 diabetes mellitus with hyperglycemia: Secondary | ICD-10-CM | POA: Diagnosis not present

## 2017-10-25 DIAGNOSIS — K210 Gastro-esophageal reflux disease with esophagitis: Secondary | ICD-10-CM | POA: Diagnosis not present

## 2017-10-25 DIAGNOSIS — I10 Essential (primary) hypertension: Secondary | ICD-10-CM | POA: Diagnosis not present

## 2017-10-30 DIAGNOSIS — J301 Allergic rhinitis due to pollen: Secondary | ICD-10-CM | POA: Diagnosis not present

## 2017-10-30 DIAGNOSIS — I2510 Atherosclerotic heart disease of native coronary artery without angina pectoris: Secondary | ICD-10-CM | POA: Diagnosis not present

## 2017-10-30 DIAGNOSIS — E782 Mixed hyperlipidemia: Secondary | ICD-10-CM | POA: Diagnosis not present

## 2017-10-30 DIAGNOSIS — I10 Essential (primary) hypertension: Secondary | ICD-10-CM | POA: Diagnosis not present

## 2018-02-01 ENCOUNTER — Other Ambulatory Visit: Payer: Self-pay | Admitting: *Deleted

## 2018-02-01 MED ORDER — METOPROLOL TARTRATE 50 MG PO TABS: 50.0000 mg | ORAL_TABLET | Freq: Two times a day (BID) | ORAL | 3 refills | Status: DC

## 2018-02-11 NOTE — H&P (View-Only) (Signed)
  Cardiology Office Note  Date: 02/13/2018   ID: Derreon N Rajan, DOB 08/29/1960, MRN [id number]  PCP: Daniel, Terry G, MD  Primary Cardiologist: Alichia Alridge, MD   Chief Complaint  Patient presents with  . Coronary Artery Disease    History of Present Illness: Lawrence [name] is a 57 y.o. male last seen in January 2019.  He presents for a routine visit.  He tells me that over the last several months he has noticed more fatigue in general, also intermittent chest heaviness.  This has happened with exertion predominantly but not exclusively.  No episodes have been so intense for him to take nitroglycerin.  I reviewed his medications which are outlined below.  He has been off Plavix for about a year.  Tolerates Crestor.  Lab work is been followed by Dr. Daniel.  We discussed obtaining a follow-up Myoview for ischemic surveillance, last testing was in 2014. I personally reviewed his ECG today which shows normal sinus rhythm.  Past Medical History:  Diagnosis Date  . CAD (coronary artery disease)    DES circ/OM 5/11, residual 50-70% LAD, LVEF 60%  . Hyperlipidemia   . Hypertension   . Wolff-Parkinson-White (WPW) syndrome    Status post  RFA (Dr. Klein)    History reviewed. No pertinent surgical history.  Current Outpatient Medications  Medication Sig Dispense Refill  . aspirin 81 MG tablet Take 81 mg by mouth daily.      . lisinopril-hydrochlorothiazide (PRINZIDE) 10-12.5 MG per tablet Take 1 tablet by mouth daily. 30 tablet 1  . loratadine (CLARITIN) 10 MG tablet Take 10 mg by mouth daily as needed for allergies.    . metFORMIN (GLUCOPHAGE-XR) 500 MG 24 hr tablet Take 1 tablet by mouth 3 (three) times daily.  0  . metoprolol tartrate (LOPRESSOR) 50 MG tablet Take 1 tablet (50 mg total) by mouth 2 (two) times daily. 180 tablet 3  . nitroGLYCERIN (NITROSTAT) 0.4 MG SL tablet Place 1 tablet (0.4 mg total) under the tongue every 5 (five) minutes x 3 doses as needed. 25 tablet 3  .  rosuvastatin (CRESTOR) 40 MG tablet Take 1 tablet (40 mg total) by mouth daily. 90 tablet 3   No current facility-administered medications for this visit.    Allergies:  Patient has no known allergies.   Social History: The patient  reports that he quit smoking about 8 years ago. His smoking use included cigarettes. He started smoking about 36 years ago. He has a 75.00 pack-year smoking history. He has never used smokeless tobacco. He reports that he does not drink alcohol or use drugs.   ROS:  Please see the history of present illness. Otherwise, complete review of systems is positive for none.  All other systems are reviewed and negative.   Physical Exam: VS:  BP 108/70   Pulse 69   Ht 6' (1.829 m)   Wt 229 lb (103.9 kg)   SpO2 97%   BMI 31.06 kg/m , BMI Body mass index is 31.06 kg/m.  Wt Readings from Last 3 Encounters:  02/13/18 229 lb (103.9 kg)  02/01/17 235 lb 9.6 oz (106.9 kg)  01/11/16 226 lb (102.5 kg)    General: Patient appears comfortable at rest. HEENT: Conjunctiva and lids normal, oropharynx clear. Neck: Supple, no elevated JVP or carotid bruits, no thyromegaly. Lungs: Clear to auscultation, nonlabored breathing at rest. Cardiac: Regular rate and rhythm, no S3 or significant systolic murmur. Abdomen: Soft, nontender, bowel sounds present. Extremities: No pitting   edema, distal pulses 2+. Skin: Warm and dry. Musculoskeletal: No kyphosis. Neuropsychiatric: Alert and oriented x3, affect grossly appropriate.  ECG: I personally reviewed the tracing from 02/01/2017 which showed sinus rhythm with prolonged PR interval.  Recent Labwork:  November 2016: Cholesterol 157, triglycerides 777, HDL 19, LDL not calculated, BUN 11, creatinine 0.9, potassium 4.4, AST 13, ALT 14, hemoglobin A1c 12.9, TSH 1.28  Other Studies Reviewed Today:  Lexiscan Cardiolite in October 2014 showed no evidence of scar or ischemia with LVEF 67%.  Assessment and Plan:  1.  CAD with history  of DES to the circumflex/OM in 2011 with medically managed moderate LAD disease.  He is reporting more frequent angina symptoms and we will plan a follow-up Lexiscan Myoview on medical therapy, his last evaluation in 2014 was negative for ischemia.  ECG is normal today.  2.  Mixed hyperlipidemia on Crestor.  He follows with Dr. Reuel Boom.  3.  Essential hypertension, blood pressure well controlled today.  4.  History of WPW status post ablation.  Current medicines were reviewed with the patient today.   Orders Placed This Encounter  Procedures  . NM Myocar Multi W/Spect W/Wall Motion / EF  . EKG 12-Lead    Disposition: Call with test results and determine follow-up plan.  Signed, Jonelle Sidle, MD, [facility] 02/13/2018 9:40 AM    [facility] at [facility] [address] [place], [place], [place] [address] Phone: [phone]; Fax: [phone]

## 2018-02-11 NOTE — Progress Notes (Signed)
Cardiology Office Note  Date: 02/13/2018   ID: YORK HARCUM, DOB 1960/05/26, MRN [id number]  PCP: Richardean Chimera, MD  Primary Cardiologist: Nona Dell, MD   Chief Complaint  Patient presents with  . Coronary Artery Disease    History of Present Illness: Lawrence [name] is a 58 y.o. male last seen in January 2019.  He presents for a routine visit.  He tells me that over the last several months he has noticed more fatigue in general, also intermittent chest heaviness.  This has happened with exertion predominantly but not exclusively.  No episodes have been so intense for him to take nitroglycerin.  I reviewed his medications which are outlined below.  He has been off Plavix for about a year.  Tolerates Crestor.  Lab work is been followed by Dr. Reuel Boom.  We discussed obtaining a follow-up Myoview for ischemic surveillance, last testing was in 2014. I personally reviewed his ECG today which shows normal sinus rhythm.  Past Medical History:  Diagnosis Date  . CAD (coronary artery disease)    DES circ/OM 5/11, residual 50-70% LAD, LVEF 60%  . Hyperlipidemia   . Hypertension   . Wolff-Parkinson-White (WPW) syndrome    Status post  RFA (Dr. Graciela Husbands)    History reviewed. No pertinent surgical history.  Current Outpatient Medications  Medication Sig Dispense Refill  . aspirin 81 MG tablet Take 81 mg by mouth daily.      Marland Kitchen lisinopril-hydrochlorothiazide (PRINZIDE) 10-12.5 MG per tablet Take 1 tablet by mouth daily. 30 tablet 1  . loratadine (CLARITIN) 10 MG tablet Take 10 mg by mouth daily as needed for allergies.    . metFORMIN (GLUCOPHAGE-XR) 500 MG 24 hr tablet Take 1 tablet by mouth 3 (three) times daily.  0  . metoprolol tartrate (LOPRESSOR) 50 MG tablet Take 1 tablet (50 mg total) by mouth 2 (two) times daily. 180 tablet 3  . nitroGLYCERIN (NITROSTAT) 0.4 MG SL tablet Place 1 tablet (0.4 mg total) under the tongue every 5 (five) minutes x 3 doses as needed. 25 tablet 3  .  rosuvastatin (CRESTOR) 40 MG tablet Take 1 tablet (40 mg total) by mouth daily. 90 tablet 3   No current facility-administered medications for this visit.    Allergies:  Patient has no known allergies.   Social History: The patient  reports that he quit smoking about 8 years ago. His smoking use included cigarettes. He started smoking about 36 years ago. He has a 75.00 pack-year smoking history. He has never used smokeless tobacco. He reports that he does not drink alcohol or use drugs.   ROS:  Please see the history of present illness. Otherwise, complete review of systems is positive for none.  All other systems are reviewed and negative.   Physical Exam: VS:  BP 108/70   Pulse 69   Ht 6' (1.829 m)   Wt 229 lb (103.9 kg)   SpO2 97%   BMI 31.06 kg/m , BMI Body mass index is 31.06 kg/m.  Wt Readings from Last 3 Encounters:  02/13/18 229 lb (103.9 kg)  02/01/17 235 lb 9.6 oz (106.9 kg)  01/11/16 226 lb (102.5 kg)    General: Patient appears comfortable at rest. HEENT: Conjunctiva and lids normal, oropharynx clear. Neck: Supple, no elevated JVP or carotid bruits, no thyromegaly. Lungs: Clear to auscultation, nonlabored breathing at rest. Cardiac: Regular rate and rhythm, no S3 or significant systolic murmur. Abdomen: Soft, nontender, bowel sounds present. Extremities: No pitting  edema, distal pulses 2+. Skin: Warm and dry. Musculoskeletal: No kyphosis. Neuropsychiatric: Alert and oriented x3, affect grossly appropriate.  ECG: I personally reviewed the tracing from 02/01/2017 which showed sinus rhythm with prolonged PR interval.  Recent Labwork:  November 2016: Cholesterol 157, triglycerides 777, HDL 19, LDL not calculated, BUN 11, creatinine 0.9, potassium 4.4, AST 13, ALT 14, hemoglobin A1c 12.9, TSH 1.28  Other Studies Reviewed Today:  Lexiscan Cardiolite in October 2014 showed no evidence of scar or ischemia with LVEF 67%.  Assessment and Plan:  1.  CAD with history  of DES to the circumflex/OM in 2011 with medically managed moderate LAD disease.  He is reporting more frequent angina symptoms and we will plan a follow-up Lexiscan Myoview on medical therapy, his last evaluation in 2014 was negative for ischemia.  ECG is normal today.  2.  Mixed hyperlipidemia on Crestor.  He follows with Dr. Reuel Boom.  3.  Essential hypertension, blood pressure well controlled today.  4.  History of WPW status post ablation.  Current medicines were reviewed with the patient today.   Orders Placed This Encounter  Procedures  . NM Myocar Multi W/Spect W/Wall Motion / EF  . EKG 12-Lead    Disposition: Call with test results and determine follow-up plan.  Signed, Jonelle Sidle, MD, [facility] 02/13/2018 9:40 AM    [facility] at [facility] [address] [place], [place], [place] [address] Phone: [phone]; Fax: [phone]

## 2018-02-13 ENCOUNTER — Encounter: Payer: Self-pay | Admitting: Cardiology

## 2018-02-13 ENCOUNTER — Telehealth: Payer: Self-pay | Admitting: Cardiology

## 2018-02-13 ENCOUNTER — Ambulatory Visit: Payer: BLUE CROSS/BLUE SHIELD | Admitting: Cardiology

## 2018-02-13 ENCOUNTER — Encounter: Payer: Self-pay | Admitting: *Deleted

## 2018-02-13 VITALS — BP 108/70 | HR 69 | Ht 72.0 in | Wt 229.0 lb

## 2018-02-13 DIAGNOSIS — E782 Mixed hyperlipidemia: Secondary | ICD-10-CM

## 2018-02-13 DIAGNOSIS — I456 Pre-excitation syndrome: Secondary | ICD-10-CM

## 2018-02-13 DIAGNOSIS — I10 Essential (primary) hypertension: Secondary | ICD-10-CM

## 2018-02-13 DIAGNOSIS — I25119 Atherosclerotic heart disease of native coronary artery with unspecified angina pectoris: Secondary | ICD-10-CM

## 2018-02-13 MED ORDER — ROSUVASTATIN CALCIUM 20 MG PO TABS: 20.0000 mg | ORAL_TABLET | Freq: Every day | ORAL | Status: DC

## 2018-02-13 MED ORDER — GLIPIZIDE ER 10 MG PO TB24: 10.0000 mg | ORAL_TABLET | Freq: Every day | ORAL | Status: AC

## 2018-02-13 NOTE — Addendum Note (Signed)
Addended by: Lesle Chris on: 02/13/2018 09:50 AM   Modules accepted: Orders

## 2018-02-13 NOTE — Telephone Encounter (Signed)
Pre-cert Verification for the following procedure   Lexiscan scheduled for 02/22/2018 at [facility]

## 2018-02-13 NOTE — Patient Instructions (Addendum)
Medication Instructions:   Plavix removed from list today.  Continue all other current medications.  Labwork: none  Testing/Procedures:  Your physician has requested that you have a lexiscan myoview. For further information please visit [email or web address] Please follow instruction sheet, as given.  Office will contact with results via phone or letter.    Follow-Up: Pending test results   Any Other Special Instructions Will Be Listed Below (If Applicable).  If you need a refill on your cardiac medications before your next appointment, please call your pharmacy.

## 2018-02-21 ENCOUNTER — Telehealth: Payer: Self-pay | Admitting: Cardiology

## 2018-02-21 NOTE — Telephone Encounter (Signed)
Patient informed that he should take all of his medications in the morning except glipizide. Verbalized understanding.

## 2018-02-21 NOTE — Telephone Encounter (Signed)
Would like to know if he can skip all medications in the morning before his test  He is working so if he does not answer he asked if you could just leave the answer on his VM

## 2018-02-22 ENCOUNTER — Encounter (HOSPITAL_COMMUNITY)
Admission: RE | Admit: 2018-02-22 | Discharge: 2018-02-22 | Disposition: A | Discharge status: Home / Self Care | Payer: BLUE CROSS/BLUE SHIELD | Source: Ambulatory Visit | Attending: Cardiology | Admitting: Cardiology

## 2018-02-22 ENCOUNTER — Ambulatory Visit (HOSPITAL_COMMUNITY)
Admission: RE | Admit: 2018-02-22 | Discharge: 2018-02-22 | Disposition: A | Discharge status: Home / Self Care | Payer: BLUE CROSS/BLUE SHIELD | Source: Ambulatory Visit | Attending: Cardiology | Admitting: Cardiology

## 2018-02-22 ENCOUNTER — Encounter (HOSPITAL_COMMUNITY): Payer: Self-pay

## 2018-02-22 DIAGNOSIS — I25119 Atherosclerotic heart disease of native coronary artery with unspecified angina pectoris: Secondary | ICD-10-CM | POA: Diagnosis not present

## 2018-02-22 HISTORY — DX: Type 2 diabetes mellitus without complications: E11.9

## 2018-02-22 LAB — NM MYOCAR MULTI W/SPECT W/WALL MOTION / EF
CHL CUP NUCLEAR SSS: 3
LV dias vol: 80 mL (ref 62–150)
LV sys vol: 31 mL
Peak HR: 84 {beats}/min
RATE: 0.32
Rest HR: 63 {beats}/min
SDS: 1
SRS: 2
TID: 1.23

## 2018-02-22 MED ORDER — TECHNETIUM TC 99M TETROFOSMIN IV KIT
10.0000 | PACK | Freq: Once | INTRAVENOUS | Status: AC | PRN
  Administered 2018-02-22: 10 via INTRAVENOUS

## 2018-02-22 MED ORDER — REGADENOSON 0.4 MG/5ML IV SOLN
INTRAVENOUS | Status: AC
  Administered 2018-02-22: 0.4 mg via INTRAVENOUS
  Filled 2018-02-22: qty 5

## 2018-02-22 MED ORDER — SODIUM CHLORIDE 0.9% FLUSH
INTRAVENOUS | Status: AC
  Administered 2018-02-22: 10 mL via INTRAVENOUS
  Filled 2018-02-22: qty 10

## 2018-02-22 MED ORDER — TECHNETIUM TC 99M TETROFOSMIN IV KIT
30.0000 | PACK | Freq: Once | INTRAVENOUS | Status: AC | PRN
  Administered 2018-02-22: 33 via INTRAVENOUS

## 2018-02-25 ENCOUNTER — Telehealth: Payer: Self-pay | Admitting: *Deleted

## 2018-02-25 ENCOUNTER — Other Ambulatory Visit: Payer: Self-pay | Admitting: *Deleted

## 2018-02-25 ENCOUNTER — Encounter: Payer: Self-pay | Admitting: *Deleted

## 2018-02-25 DIAGNOSIS — R9439 Abnormal result of other cardiovascular function study: Secondary | ICD-10-CM

## 2018-02-25 DIAGNOSIS — Z01810 Encounter for preprocedural cardiovascular examination: Secondary | ICD-10-CM

## 2018-02-25 DIAGNOSIS — I10 Essential (primary) hypertension: Secondary | ICD-10-CM

## 2018-02-25 NOTE — Telephone Encounter (Signed)
Patient informed and verbalized understanding. States he wants to discuss having a heart catheterization with his wife first and will let us know if he agrees to proceed. Copy sent to PCP.

## 2018-02-25 NOTE — Telephone Encounter (Addendum)
Left heart cath scheduled for Monday, March 04, 2018 @9 :00 am with Dr. Okey Dupre. Patient informed via vm. Patient will come to the office on Wednesday this week to get instructions and lab orders.

## 2018-02-25 NOTE — Telephone Encounter (Signed)
Please let me know details when available and I will amend my note as well as complete orders.

## 2018-02-25 NOTE — Addendum Note (Signed)
Addended by: Eustace Moore on: 02/25/2018 04:36 PM   Modules accepted: Orders

## 2018-02-25 NOTE — Telephone Encounter (Signed)
-----   Message from Jonelle Sidle, MD sent at 02/22/2018  3:09 PM EST ----- Results reviewed.  Stress test was abnormal and does suggest two potential ischemic zones and therefore progression in CAD since prior assessment.  In light of his increasing angina symptoms, cardiac catheterization would be reasonable for reevaluation.  If he would like to pursue this, we could go ahead and get it scheduled and I can amend my recent office note and do orders subsequently. A copy of this test should be forwarded to Richardean Chimera, MD.

## 2018-02-25 NOTE — Telephone Encounter (Signed)
Patient agrees to have heart cath. Patient aware that he will be contacted with details after its scheduled.

## 2018-02-26 ENCOUNTER — Other Ambulatory Visit: Payer: Self-pay | Admitting: Cardiology

## 2018-02-26 DIAGNOSIS — R9439 Abnormal result of other cardiovascular function study: Secondary | ICD-10-CM

## 2018-02-26 NOTE — Progress Notes (Signed)
Addendum to office visit from 02/13/2018.  Patient underwent Lexiscan Myoview on 02/22/2018 that was overall intermediate risk demonstrating two areas of ischemia, one anteriorly, and the other inferiorly.  LVEF 62%.  In light of his reported increasing angina symptoms with prior history of DES intervention to the circumflex/OM in 2011 and medically managed moderate LAD disease, he is being referred for a follow-up diagnostic cardiac catheterization which is scheduled with Dr. Okey Dupre on February 17.  Risks and benefits reviewed and he is in agreement to proceed.

## 2018-02-27 ENCOUNTER — Telehealth: Payer: Self-pay | Admitting: Cardiology

## 2018-02-27 DIAGNOSIS — I10 Essential (primary) hypertension: Secondary | ICD-10-CM | POA: Diagnosis not present

## 2018-02-27 DIAGNOSIS — Z01810 Encounter for preprocedural cardiovascular examination: Secondary | ICD-10-CM | POA: Diagnosis not present

## 2018-02-27 DIAGNOSIS — R9439 Abnormal result of other cardiovascular function study: Secondary | ICD-10-CM | POA: Diagnosis not present

## 2018-02-27 NOTE — Telephone Encounter (Signed)
Pre-cert Verification for the following procedure    left heart cath Monday, 03/04/2018 @9 :00 am with Dr. Okey DupreEnd dx: abnormal stress test & chest pain

## 2018-02-28 ENCOUNTER — Telehealth: Payer: Self-pay | Admitting: *Deleted

## 2018-02-28 NOTE — Telephone Encounter (Addendum)
Pt contacted pre-catheterization scheduled at [facility] for: Monday March 04, 2018 9 AM Verified arrival time and place: [facility] Main Entrance A at: 7 AM  No solid food after midnight prior to cath, clear liquids until 5 AM day of procedure. Contrast allergy: no  Hold: Glipizide-AM of procedure. Lisinopril-HCTZ-AM of procedure.  Except hold medications AM meds can be  taken pre-cath with sip of water including: ASA 81 mg  Confirmed patient has responsible person to drive home post procedure and observe 24 hours after arriving home: yes

## 2018-03-04 ENCOUNTER — Ambulatory Visit (HOSPITAL_COMMUNITY)
Admission: RE | Admit: 2018-03-04 | Discharge: 2018-03-04 | Disposition: A | Discharge status: Home / Self Care | Payer: BLUE CROSS/BLUE SHIELD | Source: Ambulatory Visit | Attending: Internal Medicine | Admitting: Internal Medicine

## 2018-03-04 ENCOUNTER — Other Ambulatory Visit: Payer: Self-pay

## 2018-03-04 ENCOUNTER — Encounter (HOSPITAL_COMMUNITY)
Admission: RE | Disposition: A | Discharge status: Home / Self Care | Payer: Self-pay | Source: Ambulatory Visit | Attending: Internal Medicine

## 2018-03-04 DIAGNOSIS — I10 Essential (primary) hypertension: Secondary | ICD-10-CM | POA: Insufficient documentation

## 2018-03-04 DIAGNOSIS — Z955 Presence of coronary angioplasty implant and graft: Secondary | ICD-10-CM | POA: Insufficient documentation

## 2018-03-04 DIAGNOSIS — I200 Unstable angina: Secondary | ICD-10-CM | POA: Diagnosis present

## 2018-03-04 DIAGNOSIS — Z79899 Other long term (current) drug therapy: Secondary | ICD-10-CM | POA: Diagnosis not present

## 2018-03-04 DIAGNOSIS — Z7984 Long term (current) use of oral hypoglycemic drugs: Secondary | ICD-10-CM | POA: Diagnosis not present

## 2018-03-04 DIAGNOSIS — Z87891 Personal history of nicotine dependence: Secondary | ICD-10-CM | POA: Insufficient documentation

## 2018-03-04 DIAGNOSIS — I25110 Atherosclerotic heart disease of native coronary artery with unstable angina pectoris: Secondary | ICD-10-CM | POA: Diagnosis not present

## 2018-03-04 DIAGNOSIS — E782 Mixed hyperlipidemia: Secondary | ICD-10-CM | POA: Insufficient documentation

## 2018-03-04 DIAGNOSIS — Z7982 Long term (current) use of aspirin: Secondary | ICD-10-CM | POA: Diagnosis not present

## 2018-03-04 DIAGNOSIS — R9439 Abnormal result of other cardiovascular function study: Secondary | ICD-10-CM

## 2018-03-04 DIAGNOSIS — Z7902 Long term (current) use of antithrombotics/antiplatelets: Secondary | ICD-10-CM | POA: Diagnosis not present

## 2018-03-04 HISTORY — PX: CORONARY STENT INTERVENTION: CATH118234

## 2018-03-04 HISTORY — PX: LEFT HEART CATH AND CORONARY ANGIOGRAPHY: CATH118249

## 2018-03-04 LAB — GLUCOSE, CAPILLARY
GLUCOSE-CAPILLARY: 241 mg/dL — AB (ref 70–99)
Glucose-Capillary: 302 mg/dL — ABNORMAL HIGH (ref 70–99)

## 2018-03-04 LAB — POCT ACTIVATED CLOTTING TIME
Activated Clotting Time: 241 seconds
Activated Clotting Time: 252 seconds
Activated Clotting Time: 263 seconds

## 2018-03-04 SURGERY — LEFT HEART CATH AND CORONARY ANGIOGRAPHY
Anesthesia: LOCAL

## 2018-03-04 MED ORDER — FENTANYL CITRATE (PF) 100 MCG/2ML IJ SOLN
INTRAMUSCULAR | Status: AC
  Filled 2018-03-04: qty 2

## 2018-03-04 MED ORDER — LIDOCAINE HCL (PF) 1 % IJ SOLN
INTRAMUSCULAR | Status: AC
  Filled 2018-03-04: qty 30

## 2018-03-04 MED ORDER — ANGIOPLASTY BOOK
Status: AC
  Filled 2018-03-04: qty 1

## 2018-03-04 MED ORDER — HEPARIN (PORCINE) IN NACL 1000-0.9 UT/500ML-% IV SOLN
INTRAVENOUS | Status: AC
  Filled 2018-03-04: qty 1000

## 2018-03-04 MED ORDER — SODIUM CHLORIDE 0.9 % WEIGHT BASED INFUSION
1.0000 mL/kg/h | INTRAVENOUS | Status: DC
  Administered 2018-03-04: 1 mL/kg/h via INTRAVENOUS

## 2018-03-04 MED ORDER — CLOPIDOGREL BISULFATE 75 MG PO TABS: 75.0000 mg | ORAL_TABLET | Freq: Every day | ORAL | Status: DC

## 2018-03-04 MED ORDER — CLOPIDOGREL BISULFATE 75 MG PO TABS: 75.0000 mg | ORAL_TABLET | Freq: Every day | ORAL | 0 refills | Status: DC

## 2018-03-04 MED ORDER — ASPIRIN 81 MG PO CHEW: 81.0000 mg | CHEWABLE_TABLET | Freq: Every day | ORAL | Status: DC

## 2018-03-04 MED ORDER — SODIUM CHLORIDE 0.9 % IV SOLN: 250.0000 mL | INTRAVENOUS | Status: DC | PRN

## 2018-03-04 MED ORDER — ONDANSETRON HCL 4 MG/2ML IJ SOLN: 4.0000 mg | Freq: Four times a day (QID) | INTRAMUSCULAR | Status: DC | PRN

## 2018-03-04 MED ORDER — ASPIRIN 81 MG PO CHEW: 81.0000 mg | CHEWABLE_TABLET | ORAL | Status: DC

## 2018-03-04 MED ORDER — VERAPAMIL HCL 2.5 MG/ML IV SOLN
INTRAVENOUS | Status: DC | PRN
  Administered 2018-03-04: 10 mL via INTRA_ARTERIAL

## 2018-03-04 MED ORDER — HEPARIN SODIUM (PORCINE) 1000 UNIT/ML IJ SOLN
INTRAMUSCULAR | Status: AC
  Filled 2018-03-04: qty 1

## 2018-03-04 MED ORDER — SODIUM CHLORIDE 0.9% FLUSH: 3.0000 mL | INTRAVENOUS | Status: DC | PRN

## 2018-03-04 MED ORDER — LIDOCAINE HCL (PF) 1 % IJ SOLN
INTRAMUSCULAR | Status: DC | PRN
  Administered 2018-03-04: 2 mL via INTRADERMAL

## 2018-03-04 MED ORDER — LABETALOL HCL 5 MG/ML IV SOLN: 10.0000 mg | INTRAVENOUS | Status: AC | PRN

## 2018-03-04 MED ORDER — FAMOTIDINE IN NACL 20-0.9 MG/50ML-% IV SOLN
INTRAVENOUS | Status: AC
  Filled 2018-03-04: qty 50

## 2018-03-04 MED ORDER — SODIUM CHLORIDE 0.9% FLUSH: 3.0000 mL | Freq: Two times a day (BID) | INTRAVENOUS | Status: DC

## 2018-03-04 MED ORDER — CLOPIDOGREL BISULFATE 75 MG PO TABS: 75.0000 mg | ORAL_TABLET | Freq: Every day | ORAL | 3 refills | Status: DC

## 2018-03-04 MED ORDER — SODIUM CHLORIDE 0.9 % IV SOLN: INTRAVENOUS | Status: AC

## 2018-03-04 MED ORDER — FENTANYL CITRATE (PF) 100 MCG/2ML IJ SOLN
INTRAMUSCULAR | Status: DC | PRN
  Administered 2018-03-04 (×2): 25 ug via INTRAVENOUS

## 2018-03-04 MED ORDER — VERAPAMIL HCL 2.5 MG/ML IV SOLN
INTRAVENOUS | Status: AC
  Filled 2018-03-04: qty 2

## 2018-03-04 MED ORDER — INSULIN ASPART 100 UNIT/ML ~~LOC~~ SOLN
5.0000 [IU] | Freq: Once | SUBCUTANEOUS | Status: AC
  Administered 2018-03-04: 5 [IU] via SUBCUTANEOUS
  Filled 2018-03-04: qty 1

## 2018-03-04 MED ORDER — HEPARIN SODIUM (PORCINE) 1000 UNIT/ML IJ SOLN
INTRAMUSCULAR | Status: DC | PRN
  Administered 2018-03-04: 5000 [IU] via INTRAVENOUS
  Administered 2018-03-04: 3000 [IU] via INTRAVENOUS
  Administered 2018-03-04: 2000 [IU] via INTRAVENOUS
  Administered 2018-03-04: 5000 [IU] via INTRAVENOUS

## 2018-03-04 MED ORDER — ACETAMINOPHEN 325 MG PO TABS: 650.0000 mg | ORAL_TABLET | ORAL | Status: DC | PRN

## 2018-03-04 MED ORDER — MIDAZOLAM HCL 2 MG/2ML IJ SOLN
INTRAMUSCULAR | Status: DC | PRN
  Administered 2018-03-04 (×2): 1 mg via INTRAVENOUS

## 2018-03-04 MED ORDER — IOHEXOL 350 MG/ML SOLN
INTRAVENOUS | Status: DC | PRN
  Administered 2018-03-04: 135 mL via INTRACARDIAC

## 2018-03-04 MED ORDER — SODIUM CHLORIDE 0.9 % WEIGHT BASED INFUSION
3.0000 mL/kg/h | INTRAVENOUS | Status: AC
  Administered 2018-03-04: 3 mL/kg/h via INTRAVENOUS

## 2018-03-04 MED ORDER — NITROGLYCERIN 1 MG/10 ML FOR IR/CATH LAB
INTRA_ARTERIAL | Status: DC | PRN
  Administered 2018-03-04 (×2): 200 ug via INTRACORONARY

## 2018-03-04 MED ORDER — NITROGLYCERIN 1 MG/10 ML FOR IR/CATH LAB
INTRA_ARTERIAL | Status: AC
  Filled 2018-03-04: qty 10

## 2018-03-04 MED ORDER — CLOPIDOGREL BISULFATE 300 MG PO TABS
ORAL_TABLET | ORAL | Status: DC | PRN
  Administered 2018-03-04: 600 mg via ORAL

## 2018-03-04 MED ORDER — HYDRALAZINE HCL 20 MG/ML IJ SOLN: 5.0000 mg | INTRAMUSCULAR | Status: AC | PRN

## 2018-03-04 MED ORDER — FAMOTIDINE IN NACL 20-0.9 MG/50ML-% IV SOLN
INTRAVENOUS | Status: AC | PRN
  Administered 2018-03-04: 20 mg via INTRAVENOUS

## 2018-03-04 MED ORDER — HEPARIN (PORCINE) IN NACL 1000-0.9 UT/500ML-% IV SOLN
INTRAVENOUS | Status: DC | PRN
  Administered 2018-03-04 (×2): 500 mL

## 2018-03-04 MED ORDER — MIDAZOLAM HCL 2 MG/2ML IJ SOLN
INTRAMUSCULAR | Status: AC
  Filled 2018-03-04: qty 2

## 2018-03-04 MED FILL — CLOPIDOGREL 75 MG TABLET: 75 | 30 days supply | Qty: 30 | Fill #0

## 2018-03-04 SURGICAL SUPPLY — 21 items
BALLN EMERGE MR 2.5X12 (BALLOONS) ×2
BALLN WOLVERINE 2.75X10 (BALLOONS) ×2
BALLN ~~LOC~~ EMERGE MR 3.25X12 (BALLOONS) ×2
BALLOON EMERGE MR 2.5X12 (BALLOONS) IMPLANT
BALLOON WOLVERINE 2.75X10 (BALLOONS) IMPLANT
BALLOON ~~LOC~~ EMERGE MR 3.25X12 (BALLOONS) IMPLANT
CATH 5FR JL3.5 JR4 ANG PIG MP (CATHETERS) ×1 IMPLANT
CATH INFINITI 5 FR MPA2 (CATHETERS) ×1 IMPLANT
CATH LAUNCHER 6FR EBU3.5 (CATHETERS) ×1 IMPLANT
DEVICE RAD COMP TR BAND LRG (VASCULAR PRODUCTS) ×1 IMPLANT
GLIDESHEATH SLEND SS 6F .021 (SHEATH) ×1 IMPLANT
GUIDEWIRE INQWIRE 1.5J.035X260 (WIRE) IMPLANT
INQWIRE 1.5J .035X260CM (WIRE) ×2
KIT ENCORE 26 ADVANTAGE (KITS) ×1 IMPLANT
KIT HEART LEFT (KITS) ×2 IMPLANT
PACK CARDIAC CATHETERIZATION (CUSTOM PROCEDURE TRAY) ×2 IMPLANT
STENT SYNERGY DES 3X16 (Permanent Stent) ×1 IMPLANT
SYR MEDRAD MARK 7 150ML (SYRINGE) ×2 IMPLANT
TRANSDUCER W/STOPCOCK (MISCELLANEOUS) ×2 IMPLANT
TUBING CIL FLEX 10 FLL-RA (TUBING) ×2 IMPLANT
WIRE SION BLUE 180 (WIRE) ×1 IMPLANT

## 2018-03-04 NOTE — Discharge Instructions (Signed)
Radial Site Care ° °This sheet gives you information about how to care for yourself after your procedure. Your health care provider may also give you more specific instructions. If you have problems or questions, contact your health care provider. °What can I expect after the procedure? °After the procedure, it is common to have: °· Bruising and tenderness at the catheter insertion area. °Follow these instructions at home: °Medicines °· Take over-the-counter and prescription medicines only as told by your health care provider. °Insertion site care °· Follow instructions from your health care provider about how to take care of your insertion site. Make sure you: °? Wash your hands with soap and water before you change your bandage (dressing). If soap and water are not available, use hand sanitizer. °? Change your dressing as told by your health care provider. °? Leave stitches (sutures), skin glue, or adhesive strips in place. These skin closures may need to stay in place for 2 weeks or longer. If adhesive strip edges start to loosen and curl up, you may trim the loose edges. Do not remove adhesive strips completely unless your health care provider tells you to do that. °· Check your insertion site every day for signs of infection. Check for: °? Redness, swelling, or pain. °? Fluid or blood. °? Pus or a bad smell. °? Warmth. °· Do not take baths, swim, or use a hot tub until your health care provider approves. °· You may shower 24-48 hours after the procedure, or as directed by your health care provider. °? Remove the dressing and gently wash the site with plain soap and water. °? Pat the area dry with a clean towel. °? Do not rub the site. That could cause bleeding. °· Do not apply powder or lotion to the site. °Activity ° °· For 24 hours after the procedure, or as directed by your health care provider: °? Do not flex or bend the affected arm. °? Do not push or pull heavy objects with the affected arm. °? Do not  drive yourself home from the hospital or clinic. You may drive 24 hours after the procedure unless your health care provider tells you not to. °? Do not operate machinery or power tools. °· Do not lift anything that is heavier than 10 lb (4.5 kg), or the limit that you are told, until your health care provider says that it is safe. °· Ask your health care provider when it is okay to: °? Return to work or school. °? Resume usual physical activities or sports. °? Resume sexual activity. °General instructions °· If the catheter site starts to bleed, raise your arm and put firm pressure on the site. If the bleeding does not stop, get help right away. This is a medical emergency. °· If you went home on the same day as your procedure, a responsible adult should be with you for the first 24 hours after you arrive home. °· Keep all follow-up visits as told by your health care provider. This is important. °Contact a health care provider if: °· You have a fever. °· You have redness, swelling, or yellow drainage around your insertion site. °Get help right away if: °· You have unusual pain at the radial site. °· The catheter insertion area swells very fast. °· The insertion area is bleeding, and the bleeding does not stop when you hold steady pressure on the area. °· Your arm or hand becomes pale, cool, tingly, or numb. °These symptoms may represent a serious problem   that is an emergency. Do not wait to see if the symptoms will go away. Get medical help right away. Call your local emergency services (911 in the [place] Do not drive yourself to the hospital. °Summary °· After the procedure, it is common to have bruising and tenderness at the site. °· Follow instructions from your health care provider about how to take care of your radial site wound. Check the wound every day for signs of infection. °· Do not lift anything that is heavier than 10 lb (4.5 kg), or the limit that you are told, until your health care provider says  that it is safe. °This information is not intended to replace advice given to you by your health care provider. Make sure you discuss any questions you have with your health care provider. °Document Released: 02/04/2010 Document Revised: 02/07/2017 Document Reviewed: 02/07/2017 °Elsevier Interactive Patient Education © 2019 [employer] Inc[employer] ° ° ° °Moderate Conscious Sedation, Adult, Care After °These instructions provide you with information about caring for yourself after your procedure. Your health care provider may also give you more specific instructions. Your treatment has been planned according to current medical practices, but problems sometimes occur. Call your health care provider if you have any problems or questions after your procedure. °What can I expect after the procedure? °After your procedure, it is common: °· To feel sleepy for several hours. °· To feel clumsy and have poor balance for several hours. °· To have poor judgment for several hours. °· To vomit if you eat too soon. °Follow these instructions at home: °For at least 24 hours after the procedure: ° °· Do not: °? Participate in activities where you could fall or become injured. °? Drive. °? Use heavy machinery. °? Drink alcohol. °? Take sleeping pills or medicines that cause drowsiness. °? Make important decisions or sign legal documents. °? Take care of children on your own. °· Rest. °Eating and drinking °· Follow the diet recommended by your health care provider. °· If you vomit: °? Drink water, juice, or soup when you can drink without vomiting. °? Make sure you have little or no nausea before eating solid foods. °General instructions °· Have a responsible adult stay with you until you are awake and alert. °· Take over-the-counter and prescription medicines only as told by your health care provider. °· If you smoke, do not smoke without supervision. °· Keep all follow-up visits as told by your health care provider. This is  important. °Contact a health care provider if: °· You keep feeling nauseous or you keep vomiting. °· You feel light-headed. °· You develop a rash. °· You have a fever. °Get help right away if: °· You have trouble breathing. °This information is not intended to replace advice given to you by your health care provider. Make sure you discuss any questions you have with your health care provider. °Document Released: 10/23/2012 Document Revised: 06/07/2015 Document Reviewed: 04/24/2015 °Elsevier Interactive Patient Education © 2019 Elsevier Inc. ° °

## 2018-03-04 NOTE — Discharge Summary (Addendum)
Discharge Summary/SAME DAY PCI    Patient ID: Lawrence [name],  MRN: [id number], DOB/AGE: October 24, 1960 58 y.o.  Admit date: 03/04/2018 Discharge date: 03/04/2018  Primary Care Provider: Richardean Chimeraaniel, Terry G Primary Cardiologist: Dr. Diona BrownerMcDowell   Discharge Diagnoses    Principal Problem:   Accelerating angina [facility](HCC) Active Problems:   Abnormal myocardial perfusion study   Allergies No Known Allergies  Diagnostic Studies/Procedures     Cath: 03/04/2018  FINDINGS: 1. Multivessel CAD, including diffuse mid LAD disease of up to 60-70%, severe focal in-stent restenosis of mid LCx stent at the proximal margin (90%), and 50% distal RCA stenosis as well as moderate diffuse rPDA disease. 2. Normal LVEF with mildly elevated LVEDP. 3. Successful PCI to mid LCx/OM2 ISR using cutting balloon angioplasty and placement of Synergy 3.0 x 16 mm DES with 0% residual stenosis and TIMI-3 flow.  RECOMMENDATIONS: 1. DAPT with aspirin and clopidogrel for at least 6 months, ideally longer. 2. Aggressive secondary prevention. 3. Medical therapy of moderate to severe, non-critical LAD and RCA disease. 4. Same day discharge if no early post-cath complications.  Yvonne Kendallhristopher Tache Bobst, MD _____________   History of Present Illness     58 yo male with PMH of CAD s/p Lcx/OM DES '11, HTN, HL and WPW who presented to the office for chest pain. Reported he had been more fatigued and also been having intermittent chest heaviness mostly with exertion. Given symptoms he was set up for a lexiscan myoview which was abnormal with small mid anterior defect consistent with ischemia. He was set up for outpatient cardiac cath.  Hospital Course     Underwent cath noted above with Dr. Okey DupreEnd with successful PCI/DES to the mLAD 2/2 to ISR. Also had 50% in the dRCA and moderate diffuse rPDA disease. Plan for DAPT with ASA/Plavix for at least 6 months, ideally longer. Continued on home medications including statin, BB and ACE/HCTZ  combination. No complications post cath. Instructions/precautions given prior to discharge.   Lawrence [name] was seen by Dr. Okey DupreEnd and determined stable for discharge home. Follow up in the office has been arranged. Medications are listed below.   _____________  Discharge Vitals Blood pressure 109/76, pulse 66, temperature 98.1 F (36.7 C), temperature source Oral, resp. rate 18, height 6' (1.829 m), weight 102.1 kg, SpO2 98 %.  Filed Weights   03/04/18 0705  Weight: 102.1 kg    Labs & Radiologic Studies    CBC No results for input(s): WBC, NEUTROABS, HGB, HCT, MCV, PLT in the last 72 hours. Basic Metabolic Panel No results for input(s): NA, K, CL, CO2, GLUCOSE, BUN, CREATININE, CALCIUM, MG, PHOS in the last 72 hours. Liver Function Tests No results for input(s): AST, ALT, ALKPHOS, BILITOT, PROT, ALBUMIN in the last 72 hours. No results for input(s): LIPASE, AMYLASE in the last 72 hours. Cardiac Enzymes No results for input(s): CKTOTAL, CKMB, CKMBINDEX, TROPONINI in the last 72 hours. BNP Invalid input(s): POCBNP D-Dimer No results for input(s): DDIMER in the last 72 hours. Hemoglobin A1C No results for input(s): HGBA1C in the last 72 hours. Fasting Lipid Panel No results for input(s): CHOL, HDL, LDLCALC, TRIG, CHOLHDL, LDLDIRECT in the last 72 hours. Thyroid Function Tests No results for input(s): TSH, T4TOTAL, T3FREE, THYROIDAB in the last 72 hours.  Invalid input(s): FREET3 _____________  Nm Myocar Multi W/spect W/wall Motion / Ef  Result Date: 02/22/2018  No diagnostic ST segment changes to indicate ischemia.  Small, mild intensity, mid anterior defect that is reversible  and consistent with a small ischemic territory. Also basal inferior/inferoseptal defect that is partially reversible and suggestive of ischemia.  This is an intermediate risk study.  Nuclear stress EF: 62%.    Disposition   Pt is being discharged home today in good condition.  Follow-up Plans &  Appointments    Follow-up Information    Laurann Montana, PA-C Follow up on 03/15/2018.   Specialties:  Cardiology, Radiology Why:  at 2pm for your follow up appt.  Contact information: [address] [place] [address] [phone]          Discharge Instructions    Amb Referral to Cardiac Rehabilitation   Complete by:  As directed    Diagnosis:  Coronary Stents   Diet - low sodium heart healthy   Complete by:  As directed    Discharge instructions   Complete by:  As directed    Radial Site Care Refer to this sheet in the next few weeks. These instructions provide you with information on caring for yourself after your procedure. Your caregiver may also give you more specific instructions. Your treatment has been planned according to current medical practices, but problems sometimes occur. Call your caregiver if you have any problems or questions after your procedure. HOME CARE INSTRUCTIONS You may shower the day after the procedure.Remove the bandage (dressing) and gently wash the site with plain soap and water.Gently pat the site dry.  Do not apply powder or lotion to the site.  Do not submerge the affected site in water for 3 to 5 days.  Inspect the site at least twice daily.  Do not flex or bend the affected arm for 24 hours.  No lifting over 5 pounds (2.3 kg) for 5 days after your procedure.  Do not drive home if you are discharged the same day of the procedure. Have someone else drive you.  You may drive 24 hours after the procedure unless otherwise instructed by your caregiver.  What to expect: Any bruising will usually fade within 1 to 2 weeks.  Blood that collects in the tissue (hematoma) may be painful to the touch. It should usually decrease in size and tenderness within 1 to 2 weeks.  SEEK IMMEDIATE MEDICAL CARE IF: You have unusual pain at the radial site.  You have redness, warmth, swelling, or pain at the radial site.  You have drainage (other than a small  amount of blood on the dressing).  You have chills.  You have a fever or persistent symptoms for more than 72 hours.  You have a fever and your symptoms suddenly get worse.  Your arm becomes pale, cool, tingly, or numb.  You have heavy bleeding from the site. Hold pressure on the site.   PLEASE DO NOT MISS ANY DOSES OF YOUR PLAVIX!!!!! Also keep a log of you blood pressures and bring back to your follow up appt. Please call the office with any questions.   Patients taking blood thinners should generally stay away from medicines like ibuprofen, Advil, Motrin, naproxen, and Aleve due to risk of stomach bleeding. You may take Tylenol as directed or talk to your primary doctor about alternatives.   Increase activity slowly   Complete by:  As directed      Discharge Medications     Medication List    TAKE these medications   aspirin 81 MG tablet Take 81 mg by mouth daily.   clopidogrel 75 MG tablet Commonly known as:  PLAVIX Take 1  tablet (75 mg total) by mouth daily with breakfast. Start taking on:  March 05, 2018   glipiZIDE 10 MG 24 hr tablet Commonly known as:  GLIPIZIDE XL Take 1 tablet (10 mg total) by mouth daily with breakfast.   lisinopril-hydrochlorothiazide 10-12.5 MG tablet Commonly known as:  PRINZIDE Take 1 tablet by mouth daily.   metoprolol tartrate 50 MG tablet Commonly known as:  LOPRESSOR Take 1 tablet (50 mg total) by mouth 2 (two) times daily.   nitroGLYCERIN 0.4 MG SL tablet Commonly known as:  NITROSTAT Place 1 tablet (0.4 mg total) under the tongue every 5 (five) minutes x 3 doses as needed.   rosuvastatin 40 MG tablet Commonly known as:  CRESTOR Take 20 mg by mouth daily. What changed:  Another medication with the same name was removed. Continue taking this medication, and follow the directions you see here.   tetrahydrozoline-zinc 0.05-0.25 % ophthalmic solution Commonly known as:  VISINE-AC Place 2 drops into both eyes 3 (three) times daily  as needed (dry eyes).        Acute coronary syndrome (MI, NSTEMI, STEMI, etc) this admission?: No.     Outstanding Labs/Studies   N/a   Duration of Discharge Encounter   Greater than 30 minutes including physician time.  Signed, Laverda Page NP-C 03/04/2018, 2:00 PM   I have seen and examined the patient and agree with the findings and plan, as documented in the PA/NP's note, with the following additions/changes.  Patient has done well following PCI to LCx/OM2 ISR today.  He is appropriate for same-day discharge and close outpatient follow-up.  Yvonne Kendall, MD [facility] Pager: [phone]

## 2018-03-04 NOTE — Interval H&P Note (Signed)
History and Physical Interval Note:  03/04/2018 8:32 AM  Lawrence [name]  has presented today for surgery, with the diagnosis of accelerating angina and abnormal stress test  The various methods of treatment have been discussed with the patient and family. After consideration of risks, benefits and other options for treatment, the patient has consented to  Procedure(s): LEFT HEART CATH AND CORONARY ANGIOGRAPHY (N/A) as a surgical intervention .  The patient's history has been reviewed, patient examined, no change in status, stable for surgery.  I have reviewed the patient's chart and labs.  Questions were answered to the patient's satisfaction.    Cath Lab Visit (complete for each Cath Lab visit)  Clinical Evaluation Leading to the Procedure:   ACS: No.  Non-ACS:    Anginal Classification: CCS III  Anti-ischemic medical therapy: Minimal Therapy (1 class of medications)  Non-Invasive Test Results: Intermediate-risk stress test findings: cardiac mortality 1-3%/year  Prior CABG: No previous CABG  Icesis Renn

## 2018-03-04 NOTE — Brief Op Note (Addendum)
BRIEF CARDIAC CATHETERIZATION NOTE  03/04/2018  9:58 AM  PATIENT:  Lawrence [name]  58 y.o. male  PRE-OPERATIVE DIAGNOSIS:  Accelerating angina and abnormal stress test  POST-OPERATIVE DIAGNOSIS:  Same  PROCEDURE:  Procedure(s): LEFT HEART CATH AND CORONARY ANGIOGRAPHY (N/A) CORONARY STENT INTERVENTION (N/A)  SURGEON:  Surgeon(s) and Role:    * Yehia Mcbain, MD - Primary  FINDINGS: 1. Multivessel CAD, including diffuse mid LAD disease of up to 60-70%, severe focal in-stent restenosis of mid LCx stent at the proximal margin (90%), and 50% distal RCA stenosis as well as moderate diffuse rPDA disease. 2. Normal LVEF with mildly elevated LVEDP. 3. Successful PCI to mid LCx/OM2 ISR using cutting balloon angioplasty and placement of Synergy 3.0 x 16 mm DES with 0% residual stenosis and TIMI-3 flow.  RECOMMENDATIONS: 1. DAPT with aspirin and clopidogrel for at least 6 months, ideally longer. 2. Aggressive secondary prevention. 3. Medical therapy of moderate to severe, non-critical LAD and RCA disease. 4. Same day discharge if no early post-cath complications.  Yvonne Kendall, MD [facility] Pager: [phone]

## 2018-03-04 NOTE — Progress Notes (Signed)
.  [id number] Education completed with pt and wife who voiced understanding. Stressed  Importance of plavix with stent. Reviewed NTG use, carb counting, heart healthy food choices, ex ed and CRP 2. Pt does not know if he will be able to attend CRP 2 due to work schedule. Referred to [place].  Luetta Nutting RN BSN 03/04/2018 11:06 AM

## 2018-03-05 ENCOUNTER — Encounter (HOSPITAL_COMMUNITY): Payer: Self-pay | Admitting: Internal Medicine

## 2018-03-06 ENCOUNTER — Telehealth: Payer: Self-pay | Admitting: Cardiology

## 2018-03-06 NOTE — Telephone Encounter (Signed)
Patient called wanting to know if he can take an over the counter sinus pill.

## 2018-03-06 NOTE — Telephone Encounter (Signed)
Patient questions if Loratadine is okay.  Informed patient that is fine.  He should avoid medications with sudafed in them as that can affect his blood pressure and heart rate.  He verbalized understanding.

## 2018-03-09 DIAGNOSIS — I2510 Atherosclerotic heart disease of native coronary artery without angina pectoris: Secondary | ICD-10-CM | POA: Diagnosis not present

## 2018-03-09 DIAGNOSIS — J301 Allergic rhinitis due to pollen: Secondary | ICD-10-CM | POA: Diagnosis not present

## 2018-03-09 DIAGNOSIS — I10 Essential (primary) hypertension: Secondary | ICD-10-CM | POA: Diagnosis not present

## 2018-03-09 DIAGNOSIS — K219 Gastro-esophageal reflux disease without esophagitis: Secondary | ICD-10-CM | POA: Diagnosis not present

## 2018-03-09 DIAGNOSIS — Z0001 Encounter for general adult medical examination with abnormal findings: Secondary | ICD-10-CM | POA: Diagnosis not present

## 2018-03-09 DIAGNOSIS — E1165 Type 2 diabetes mellitus with hyperglycemia: Secondary | ICD-10-CM | POA: Diagnosis not present

## 2018-03-09 DIAGNOSIS — Z23 Encounter for immunization: Secondary | ICD-10-CM | POA: Diagnosis not present

## 2018-03-09 DIAGNOSIS — E782 Mixed hyperlipidemia: Secondary | ICD-10-CM | POA: Diagnosis not present

## 2018-03-09 DIAGNOSIS — E1169 Type 2 diabetes mellitus with other specified complication: Secondary | ICD-10-CM | POA: Diagnosis not present

## 2018-03-15 ENCOUNTER — Encounter: Payer: Self-pay | Admitting: *Deleted

## 2018-03-15 ENCOUNTER — Ambulatory Visit: Payer: BLUE CROSS/BLUE SHIELD | Admitting: Physician Assistant

## 2018-03-15 ENCOUNTER — Encounter: Payer: Self-pay | Admitting: Physician Assistant

## 2018-03-15 VITALS — BP 112/68 | HR 84 | Ht 72.0 in | Wt 221.0 lb

## 2018-03-15 DIAGNOSIS — I10 Essential (primary) hypertension: Secondary | ICD-10-CM | POA: Diagnosis not present

## 2018-03-15 DIAGNOSIS — E875 Hyperkalemia: Secondary | ICD-10-CM

## 2018-03-15 DIAGNOSIS — I2510 Atherosclerotic heart disease of native coronary artery without angina pectoris: Secondary | ICD-10-CM

## 2018-03-15 DIAGNOSIS — E782 Mixed hyperlipidemia: Secondary | ICD-10-CM

## 2018-03-15 MED ORDER — CLOPIDOGREL BISULFATE 75 MG PO TABS: 75.0000 mg | ORAL_TABLET | Freq: Every day | ORAL | 11 refills | Status: DC

## 2018-03-15 MED ORDER — ROSUVASTATIN CALCIUM 40 MG PO TABS: 40.0000 mg | ORAL_TABLET | Freq: Every day | ORAL | 3 refills | Status: DC

## 2018-03-15 NOTE — Patient Instructions (Addendum)
Medication Instructions:  Your physician has recommended you make the following change in your medication:  Increase Crestor to 40 mg Daily  We have refilled your Plavix ( Do not stop taking unless instructed to do so)   If you need a refill on your cardiac medications before your next appointment, please call your pharmacy.   Lab work: Your physician recommends that you return for lab work in: 6 Weeks (04/19/2018)   If you have labs (blood work) drawn today and your tests are completely normal, you will receive your results only by: Marland Kitchen MyChart Message (if you have MyChart) OR . A paper copy in the mail If you have any lab test that is abnormal or we need to change your treatment, we will call you to review the results.  Testing/Procedures: NONE   Follow-Up: At [facility], you and your health needs are our priority.  As part of our continuing mission to provide you with exceptional heart care, we have created designated Provider Care Teams.  These Care Teams include your primary Cardiologist (physician) and Advanced Practice Providers (APPs -  Physician Assistants and Nurse Practitioners) who all work together to provide you with the care you need, when you need it. You will need a follow up appointment in 3-4 months.  Please call our office 2 months in advance to schedule this appointment.  You may see Nona Dell, MD or one of the following Advanced Practice Providers on your designated Care Team:   Randall An, PA-C [facility]) . Jacolyn Reedy, PA-C [facility])  Any Other Special Instructions Will Be Listed Below (If Applicable). You have been given a work note to return on 04/01/2018  Talk to your PCP about Jardiance. ( This is a medication for Diabetes and heart disease)   Thank you for choosing [facility] [place] [facility]!

## 2018-03-15 NOTE — Progress Notes (Addendum)
Cardiology Office Note    Date:  03/15/2018  ID:  Lawrence [name] Feb 06, 1960, MRN [id number] PCP:  Richardean Chimera, MD  Cardiologist:  Nona Dell, MD  Chief Complaint: f/u PCI  History of Present Illness:  Lawrence [name] is a 58 y.o. male with history of CAD (s/p LCx/OM DES 2011, recent PCI to mid LCx/OM2 ISR), HTN, DM, HL, former tobacco abuse and WPW who presents for post-cath follow-up. He was recently seen in the office with chest pain and fatigue. Nuclear stress test was abnormal with small mid anterior defect consistent with ischemia. He was set up for outpatient cardiac cath which demonstrated multivessel CAD, including diffuse mid LAD disease of up to 60-70%, severe focal in-stent restenosis of mid LCx stent at the proximal margin (90%), and 50% distal RCA stenosis as well as moderate diffuse rPDA disease. LVEDP and LVEF were normal. He had successful PCI to mid LCx/OM2 ISR using cutting balloon angioplasty and DES placement. Dr. Okey Dupre recommended DAPT with ASA/Plavix for at least 6 months, ideally longer. Most recent labs 02/2018 showed glucose 288, BUN 13, Cr 0.77, Na 133, K 5.2, Hgb 15.1. Last lipids in 2016 from PCP showed triglycerides of 777, could not calculate LDL.  He returns for follow-up feeling much better without recurrent anginal complaints. Fatigue improved. No SOB, edema, orthopnea, bleeding at cath site. He recently had labs at a physical with Dr. Reuel Boom following his pre-cath labs. We will request a copy. Sounds like he needs to work on his sugars. He seems committed to lifestyle changes. He'd like to stay out of work another 2 weeks to begin regular activity and increase endurance - has very physical job.   Past Medical History:  Diagnosis Date  . CAD (coronary artery disease)    a. DES circ/OM 5/11. b. Cath 02/2018 with diffuse mid LAD disease of up to 60-70%, severe focal in-stent restenosis of mid LCx stent at the proximal margin (90%), and 50% distal RCA stenosis  as well as moderate diffuse rPDA disease -> s/p DES to ISR of mid LCx/OM1, normal LVEF.  . Diabetes mellitus without complication (HCC)   . Hyperlipidemia   . Hypertension   . Hypertriglyceridemia   . Wolff-Parkinson-White (WPW) syndrome    Status post  RFA (Dr. Graciela Husbands)    Past Surgical History:  Procedure Laterality Date  . CORONARY STENT INTERVENTION N/A 03/04/2018   Procedure: CORONARY STENT INTERVENTION;  Surgeon: Yvonne Kendall, MD;  [facility] IN[facility];  Service: Cardiovascular;  Laterality: N/A;  . LEFT HEART CATH AND CORONARY ANGIOGRAPHY N/A 03/04/2018   Procedure: LEFT HEART CATH AND CORONARY ANGIOGRAPHY;  Surgeon: Yvonne Kendall, MD;  Location: [facility] INV[facility] CV LAB;  Service: Cardiovascular;  Laterality: N/A;    Current Medications: Current Meds  Medication Sig  . aspirin 81 MG tablet Take 81 mg by mouth daily.    . clopidogrel (PLAVIX) 75 MG tablet Take 1 tablet (75 mg total) by mouth daily with breakfast.  . glipiZIDE (GLIPIZIDE XL) 10 MG 24 hr tablet Take 1 tablet (10 mg total) by mouth daily with breakfast.  . lisinopril-hydrochlorothiazide (PRINZIDE) 10-12.5 MG per tablet Take 1 tablet by mouth daily.  . metoprolol tartrate (LOPRESSOR) 50 MG tablet Take 1 tablet (50 mg total) by mouth 2 (two) times daily.  . nitroGLYCERIN (NITROSTAT) 0.4 MG SL tablet Place 1 tablet (0.4 mg total) under the tongue every 5 (five) minutes x 3 doses as needed.  . rosuvastatin (CRESTOR) 40 MG tablet  Take 20 mg by mouth daily.  Marland Kitchen tetrahydrozoline-zinc (VISINE-AC) 0.05-0.25 % ophthalmic solution Place 2 drops into both eyes 3 (three) times daily as needed (dry eyes).      Allergies:   Patient has no known allergies.   Social History   Socioeconomic History  . Marital status: Married    Spouse name: Not on file  . Number of children: Not on file  . Years of education: Not on file  . Highest education level: Not on file  Occupational History  . Not on file  Social Needs  .  Financial resource strain: Not on file  . Food insecurity:    Worry: Not on file    Inability: Not on file  . Transportation needs:    Medical: Not on file    Non-medical: Not on file  Tobacco Use  . Smoking status: Former Smoker    Packs/day: 2.50    Years: 30.00    Pack years: 75.00    Types: Cigarettes    Start date: 02/15/1982    Last attempt to quit: 05/16/2009    Years since quitting: 8.8  . Smokeless tobacco: Never Used  Substance and Sexual Activity  . Alcohol use: No    Alcohol/week: 0.0 standard drinks    Comment: occasional glass of wine 2 times a year   . Drug use: No  . Sexual activity: Yes    Partners: Female  Lifestyle  . Physical activity:    Days per week: Not on file    Minutes per session: Not on file  . Stress: Not on file  Relationships  . Social connections:    Talks on phone: Not on file    Gets together: Not on file    Attends religious service: Not on file    Active member of club or organization: Not on file    Attends meetings of clubs or organizations: Not on file    Relationship status: Not on file  Other Topics Concern  . Not on file  Social History Narrative  . Not on file     Family History:  The patient's family history includes Coronary artery disease in his father and mother; Heart attack (age of onset: 49) in his father; Heart attack (age of onset: 97) in his mother.  ROS:   Please see the history of present illness. All other systems are reviewed and otherwise negative.    PHYSICAL EXAM:   VS:  BP 112/68   Pulse 84   Ht 6' (1.829 m)   Wt 221 lb (100.2 kg)   SpO2 96%   BMI 29.97 kg/m   BMI: Body mass index is 29.97 kg/m. GEN: Well nourished, well developed WM in no acute distress HEENT: normocephalic, atraumatic Neck: no JVD, carotid bruits, or masses Cardiac: RRR; no murmurs, rubs, or gallops, no edema  Respiratory:  clear to auscultation bilaterally, normal work of breathing GI: soft, nontender, nondistended, +  BS MS: no deformity or atrophy Skin: warm and dry, no rash. Right radial cath site without hematoma or ecchymosis; good pulse. Neuro:  Alert and Oriented x 3, Strength and sensation are intact, follows commands Psych: euthymic mood, full affect  Wt Readings from Last 3 Encounters:  03/15/18 221 lb (100.2 kg)  03/04/18 225 lb (102.1 kg)  02/13/18 229 lb (103.9 kg)      Studies/Labs Reviewed:   EKG:  EKG was ordered today and personally reviewed by me and demonstrates NSR 82bpm no acute STT changes, TWI  avL.  Recent Labs: No results found for requested labs within last 8760 hours.   Lipid Panel    Component Value Date/Time   CHOL (H) 06/13/2009 0630    208        ATP III CLASSIFICATION:  <200     mg/dL   Desirable  300-762  mg/dL   Borderline High  >=263    mg/dL   High          TRIG 335 (H) 06/13/2009 0630   HDL 27 (L) 06/13/2009 0630   CHOLHDL 7.7 06/13/2009 0630   VLDL 43 (H) 06/13/2009 0630   LDLCALC (H) 06/13/2009 0630    138        Total Cholesterol/HDL:CHD Risk Coronary Heart Disease Risk Table                     Men   Women  1/2 Average Risk   3.4   3.3  Average Risk       5.0   4.4  2 X Average Risk   9.6   7.1  3 X Average Risk  23.4   11.0        Use the calculated Patient Ratio above and the CHD Risk Table to determine the patient's CHD Risk.        ATP III CLASSIFICATION (LDL):  <100     mg/dL   Optimal  456-256  mg/dL   Near or Above                    Optimal  130-159  mg/dL   Borderline  389-373  mg/dL   High  >428     mg/dL   Very High    Additional studies/ records that were reviewed today include: Summarized above.    ASSESSMENT & PLAN:   1. CAD - doing well post PCI. Will write work note to return to work 3/16. He does not think he will do CR program but plans to begin exercising in a nearby park. Continue DAPT. Will refill Plavix - he is to continue at least 6 months, longer if tolerating. He was instructed not to stop until advised  by Dr. Diona Browner. Continue BB, statin. See below on lipids. I also asked him to talk to PCP about Jardiance as an option for DM control given concomitant CAD. 2. Essential HTN - BP controlled. K was a little high on pre-cath labs. He reports this was rechecked at recent physical and was fine. Will request labs from PCP to review. 3. Hyperlipidemia - reports prior joint aches with Lipitor but says he's tolerating Crestor well. Increase Crestor to 40mg . Check lipid profile, CMET, direct LDL fasting in 6 weeks. If LDL is >70, consider PCSK9 although he did not seem interested in this today. 4. Hyperkalemia - see above - will request repeat labs by PCP.  Disposition: F/u with Dr. Diona Browner in 3-4 months.  Medication Adjustments/Labs and Tests Ordered: Current medicines are reviewed at length with the patient today.  Concerns regarding medicines are outlined above. Medication changes, Labs and Tests ordered today are summarized above and listed in the Patient Instructions accessible in Encounters.   Signed, Laurann Montana, PA-C  03/15/2018 2:13 PM    [facility] - [place] Location in [facility] [address] [place], [place] [address] Ph: [phone]; Fax [phone]

## 2018-03-15 NOTE — Addendum Note (Signed)
Addended by: Kerney Elbe on: 03/15/2018 03:26 PM   Modules accepted: Orders

## 2018-06-20 ENCOUNTER — Ambulatory Visit: Payer: BLUE CROSS/BLUE SHIELD | Admitting: Cardiology

## 2018-07-04 DIAGNOSIS — E291 Testicular hypofunction: Secondary | ICD-10-CM | POA: Diagnosis not present

## 2018-07-04 DIAGNOSIS — K210 Gastro-esophageal reflux disease with esophagitis: Secondary | ICD-10-CM | POA: Diagnosis not present

## 2018-07-04 DIAGNOSIS — E1169 Type 2 diabetes mellitus with other specified complication: Secondary | ICD-10-CM | POA: Diagnosis not present

## 2018-07-04 DIAGNOSIS — E1165 Type 2 diabetes mellitus with hyperglycemia: Secondary | ICD-10-CM | POA: Diagnosis not present

## 2018-07-04 DIAGNOSIS — E782 Mixed hyperlipidemia: Secondary | ICD-10-CM | POA: Diagnosis not present

## 2018-07-04 DIAGNOSIS — I10 Essential (primary) hypertension: Secondary | ICD-10-CM | POA: Diagnosis not present

## 2018-07-09 DIAGNOSIS — I10 Essential (primary) hypertension: Secondary | ICD-10-CM | POA: Diagnosis not present

## 2018-07-09 DIAGNOSIS — E782 Mixed hyperlipidemia: Secondary | ICD-10-CM | POA: Diagnosis not present

## 2018-07-09 DIAGNOSIS — I2510 Atherosclerotic heart disease of native coronary artery without angina pectoris: Secondary | ICD-10-CM | POA: Diagnosis not present

## 2018-07-09 DIAGNOSIS — J301 Allergic rhinitis due to pollen: Secondary | ICD-10-CM | POA: Diagnosis not present

## 2018-09-09 DIAGNOSIS — S93331A Other subluxation of right foot, initial encounter: Secondary | ICD-10-CM | POA: Diagnosis not present

## 2018-09-09 DIAGNOSIS — L110 Acquired keratosis follicularis: Secondary | ICD-10-CM | POA: Diagnosis not present

## 2018-09-09 DIAGNOSIS — M779 Enthesopathy, unspecified: Secondary | ICD-10-CM | POA: Diagnosis not present

## 2018-09-09 DIAGNOSIS — S93332A Other subluxation of left foot, initial encounter: Secondary | ICD-10-CM | POA: Diagnosis not present

## 2018-09-10 NOTE — Progress Notes (Signed)
Cardiology Office Note  Date: 09/11/2018   ID: Antone, Villaruz 06-Jul-1960, MRN [id number]  PCP:  Richardean Chimera, MD  Cardiologist:  Nona Dell, MD Electrophysiologist:  None   Chief Complaint  Patient presents with  . Cardiac follow-up    History of Present Illness: Lawrence [name] is a 58 y.o. male last seen in February by Ms. Dunn PA-C.  He presents for a follow-up visit.  States that he has been doing well, no angina symptoms or nitroglycerin use.  He is currently on furlough from [employer].  He has been spending time with his son working on old cars.  I reviewed his medications which are outlined below.  He has not been taking aspirin, we discussed going back on aspirin 81 mg daily with his Plavix.  We also discussed getting a follow-up lipid panel.  He has been tolerating Crestor at 40 mg daily.  His triglycerides were elevated back in February.  Cardiac catheterization and PCI from February of this year are outlined below.  He underwent cutting balloon angioplasty and DES placement to the mid circumflex/OM 2 due to in-stent restenosis.  I reviewed his lab work from February.  Past Medical History:  Diagnosis Date  . CAD (coronary artery disease)    a. DES circ/OM 5/11. b. Cath 02/2018 with diffuse mid LAD disease of up to 60-70%, severe focal in-stent restenosis of mid LCx stent at the proximal margin (90%), and 50% distal RCA stenosis as well as moderate diffuse rPDA disease -> s/p DES to ISR of mid LCx/OM1, normal LVEF.  . Diabetes mellitus without complication (HCC)   . Hyperlipidemia   . Hypertension   . Hypertriglyceridemia   . Wolff-Parkinson-White (WPW) syndrome    Status post  RFA (Dr. Graciela Husbands)    Past Surgical History:  Procedure Laterality Date  . CORONARY STENT INTERVENTION N/A 03/04/2018   Procedure: CORONARY STENT INTERVENTION;  Surgeon: Yvonne Kendall, MD;  [facility]: [facility];  Service: Cardiovascular;  Laterality: N/A;  . LEFT HEART CATH  AND CORONARY ANGIOGRAPHY N/A 03/04/2018   Procedure: LEFT HEART CATH AND CORONARY ANGIOGRAPHY;  Surgeon: Yvonne Kendall, MD;  Location: [facility];  Service: Cardiovascular;  Laterality: N/A;    Current Outpatient Medications  Medication Sig Dispense Refill  . clopidogrel (PLAVIX) 75 MG tablet Take 1 tablet (75 mg total) by mouth daily with breakfast. Continue this medication until instructed to stop. 30 tablet 11  . glipiZIDE (GLIPIZIDE XL) 10 MG 24 hr tablet Take 1 tablet (10 mg total) by mouth daily with breakfast.    . lisinopril-hydrochlorothiazide (PRINZIDE) 10-12.5 MG per tablet Take 1 tablet by mouth daily. 30 tablet 1  . metoprolol tartrate (LOPRESSOR) 50 MG tablet Take 1 tablet (50 mg total) by mouth 2 (two) times daily. 180 tablet 3  . nitroGLYCERIN (NITROSTAT) 0.4 MG SL tablet Place 1 tablet (0.4 mg total) under the tongue every 5 (five) minutes x 3 doses as needed. 25 tablet 3  . rosuvastatin (CRESTOR) 40 MG tablet Take 1 tablet (40 mg total) by mouth daily. 90 tablet 3  . tetrahydrozoline-zinc (VISINE-AC) 0.05-0.25 % ophthalmic solution Place 2 drops into both eyes 3 (three) times daily as needed (dry eyes).    Marland Kitchen aspirin EC 81 MG tablet Take 1 tablet (81 mg total) by mouth daily. 90 tablet 3   No current facility-administered medications for this visit.    Allergies:  Patient has no known allergies.   Social History: The  patient  reports that he quit smoking about 9 years ago. His smoking use included cigarettes. He started smoking about 36 years ago. He has a 75.00 pack-year smoking history. He has never used smokeless tobacco. He reports that he does not drink alcohol or use drugs.   ROS:  Please see the history of present illness. Otherwise, complete review of systems is positive for none.  All other systems are reviewed and negative.   Physical Exam: VS:  BP 122/62   Pulse 64   Ht 6' (1.829 m)   Wt 215 lb (97.5 kg)   SpO2 98%   BMI 29.16 kg/m , BMI Body mass  index is 29.16 kg/m.  Wt Readings from Last 3 Encounters:  09/11/18 215 lb (97.5 kg)  03/15/18 221 lb (100.2 kg)  03/04/18 225 lb (102.1 kg)    General: Patient appears comfortable at rest. HEENT: Conjunctiva and lids normal, wearing a mask. Neck: Supple, no elevated JVP or carotid bruits, no thyromegaly. Lungs: Clear to auscultation, nonlabored breathing at rest. Cardiac: Regular rate and rhythm, no S3 or significant systolic murmur. Abdomen: Soft, nontender, bowel sounds present, no guarding or rebound. Extremities: No pitting edema, distal pulses 2+. Skin: Warm and dry. Musculoskeletal: No kyphosis. Neuropsychiatric: Alert and oriented x3, affect grossly appropriate.  ECG:  An ECG dated 03/15/2018 was personally reviewed today and demonstrated:  Normal sinus rhythm.  Recent Labwork:  February 2020: BUN 13, creatinine 0.86, potassium 4.6, AST 13, ALT 19, cholesterol 155, triglycerides 579, HDL 20, TSH 2.59, hemoglobin 14.4, platelets 261, hemoglobin A1c 11.8%   Other Studies Reviewed Today:  Cardiac catheterization and PCI 03/04/2018: Conclusions: 1. Multivessel coronary artery disease, including diffuse mid LAD disease of up to 60-70%, severe focal in-stent restenosis of mid LCx stent at the proximal margin (90%), and 50% distal RCA stenosis as well as moderate diffuse rPDA disease. 2. Mid inferior hypokinesis with otherwise preserved left ventricular filling pressure and mildly elevated LVEDP. 3. Successful PCI to mid LCx/OM2 ISR using cutting balloon angioplasty and placement of Synergy 3.0 x 16 mm drug-eluting stent with 0% residual stenosis and TIMI-3 flow.  Recommendations: 1. Dual antiplatelet therapy with aspirin and clopidogrel for at least 6 months, ideally longer. 2. Aggressive secondary prevention. 3. Medical therapy of moderate to severe, non-critical LAD and RCA disease. 4. Same day discharge if no early post-cath complications.  Assessment and Plan:  1.   Multivessel CAD status post prior PCI, most recently with cutting balloon angioplasty and DES intervention to the mid circumflex/OM 2 due to in-stent restenosis back in February.  He is to start back on aspirin 81 mg daily, continue Plavix.  No other changes in current regimen.  2.  Mixed hyperlipidemia.  Follow-up FLP and LFTs on high-dose Crestor.  May the omega-3 supplement depending on triglycerides.  3.  Essential hypertension, blood pressure is well controlled today.  He will continue on Prinzide and Lopressor.  4.  Type 2 diabetes mellitus, currently on glipizide XL with follow-up by PCP.  Medication Adjustments/Labs and Tests Ordered: Current medicines are reviewed at length with the patient today.  Concerns regarding medicines are outlined above.   Tests Ordered: Orders Placed This Encounter  Procedures  . Hepatic function panel  . Lipid panel    Medication Changes: Meds ordered this encounter  Medications  . aspirin EC 81 MG tablet    Sig: Take 1 tablet (81 mg total) by mouth daily.    Dispense:  90 tablet    Refill:  3    Disposition:  Follow up 6 months in the [place] office.  Signed, Jonelle SidleSamuel G. Baylei Siebels, MD, [facility] CenterFACC 09/11/2018 8:43 AM    [facility] [address], [place], [place] [address] Phone: [phone]; Fax: [phone]

## 2018-09-11 ENCOUNTER — Other Ambulatory Visit: Payer: Self-pay

## 2018-09-11 ENCOUNTER — Encounter: Payer: Self-pay | Admitting: Cardiology

## 2018-09-11 ENCOUNTER — Ambulatory Visit: Payer: BC Managed Care – PPO | Admitting: Cardiology

## 2018-09-11 VITALS — BP 122/62 | HR 64 | Ht 72.0 in | Wt 215.0 lb

## 2018-09-11 DIAGNOSIS — E1165 Type 2 diabetes mellitus with hyperglycemia: Secondary | ICD-10-CM | POA: Diagnosis not present

## 2018-09-11 DIAGNOSIS — E782 Mixed hyperlipidemia: Secondary | ICD-10-CM | POA: Diagnosis not present

## 2018-09-11 DIAGNOSIS — I25119 Atherosclerotic heart disease of native coronary artery with unspecified angina pectoris: Secondary | ICD-10-CM

## 2018-09-11 DIAGNOSIS — Z79899 Other long term (current) drug therapy: Secondary | ICD-10-CM

## 2018-09-11 DIAGNOSIS — I10 Essential (primary) hypertension: Secondary | ICD-10-CM | POA: Diagnosis not present

## 2018-09-11 MED ORDER — ASPIRIN EC 81 MG PO TBEC: 81.0000 mg | DELAYED_RELEASE_TABLET | Freq: Every day | ORAL | 3 refills | Status: DC

## 2018-09-11 NOTE — Patient Instructions (Addendum)
Medication Instructions:   Your physician has recommended you make the following change in your medication:   Please take aspirin 81 mg by mouth daily  Continue all other medications the same  Labwork:  Your physician recommends that you return for a FASTING lipid & liver profile: as soon as possible. Please do not eat or drink for at least 8 hours when you have this done. You may have this done at [facility] or [employer] or [facility] in [place].   Testing/Procedures:  NONE  Follow-Up:  Your physician recommends that you schedule a follow-up appointment in: 6 months. You will receive a reminder letter in the mail in about 4 months reminding you to call and schedule your appointment. If you don't receive this letter, please contact our office.   Any Other Special Instructions Will Be Listed Below (If Applicable).  If you need a refill on your cardiac medications before your next appointment, please call your pharmacy.

## 2018-09-12 ENCOUNTER — Telehealth: Payer: Self-pay | Admitting: *Deleted

## 2018-09-12 DIAGNOSIS — E782 Mixed hyperlipidemia: Secondary | ICD-10-CM

## 2018-09-12 DIAGNOSIS — Z79899 Other long term (current) drug therapy: Secondary | ICD-10-CM

## 2018-09-12 MED ORDER — FISH OIL 1000 MG PO CAPS: 2000.0000 mg | ORAL_CAPSULE | Freq: Two times a day (BID) | ORAL | 6 refills | Status: DC

## 2018-09-12 NOTE — Telephone Encounter (Signed)
Patient informed and confirmed that he was fasting when lab work was done yesterday. Verbalized understanding. Copy sent to PCP. Lab order faxed to [facility].

## 2018-09-12 NOTE — Telephone Encounter (Signed)
-----   Message from Satira Sark, MD sent at 09/11/2018  1:20 PM EDT ----- Results reviewed.  Triglycerides are very high, not sure if this was fasting or not but either way he needs to start back on omega-3 supplements, I would go with 2000 mg twice daily.  Stay on high-dose Crestor.  Keep close track of diabetes control with Dr. Quillian Quince.  We should recheck a fasting lipid panel in 8 weeks.

## 2018-09-12 NOTE — Telephone Encounter (Signed)
Mr. Lawrence [name] returned your call

## 2018-09-12 NOTE — Addendum Note (Signed)
Addended by: Merlene Laughter on: 09/12/2018 02:21 PM   Modules accepted: Orders

## 2018-10-28 ENCOUNTER — Other Ambulatory Visit: Payer: Self-pay

## 2018-10-28 DIAGNOSIS — Z20828 Contact with and (suspected) exposure to other viral communicable diseases: Secondary | ICD-10-CM | POA: Diagnosis not present

## 2018-10-28 DIAGNOSIS — Z20822 Contact with and (suspected) exposure to covid-19: Secondary | ICD-10-CM

## 2018-10-29 ENCOUNTER — Telehealth: Payer: Self-pay | Admitting: General Practice

## 2018-10-29 LAB — NOVEL CORONAVIRUS, NAA: SARS-CoV-2, NAA: NOT DETECTED

## 2018-10-29 NOTE — Telephone Encounter (Signed)
Negative COVID results given. Patient results "NOT Detected." Caller expressed understanding. ° °

## 2018-11-01 DIAGNOSIS — E1165 Type 2 diabetes mellitus with hyperglycemia: Secondary | ICD-10-CM | POA: Diagnosis not present

## 2018-11-01 DIAGNOSIS — E782 Mixed hyperlipidemia: Secondary | ICD-10-CM | POA: Diagnosis not present

## 2018-11-01 DIAGNOSIS — I10 Essential (primary) hypertension: Secondary | ICD-10-CM | POA: Diagnosis not present

## 2018-11-01 DIAGNOSIS — K219 Gastro-esophageal reflux disease without esophagitis: Secondary | ICD-10-CM | POA: Diagnosis not present

## 2018-11-06 DIAGNOSIS — K219 Gastro-esophageal reflux disease without esophagitis: Secondary | ICD-10-CM | POA: Diagnosis not present

## 2018-11-06 DIAGNOSIS — I10 Essential (primary) hypertension: Secondary | ICD-10-CM | POA: Diagnosis not present

## 2018-11-06 DIAGNOSIS — E782 Mixed hyperlipidemia: Secondary | ICD-10-CM | POA: Diagnosis not present

## 2018-11-06 DIAGNOSIS — I2510 Atherosclerotic heart disease of native coronary artery without angina pectoris: Secondary | ICD-10-CM | POA: Diagnosis not present

## 2018-11-06 DIAGNOSIS — Z23 Encounter for immunization: Secondary | ICD-10-CM | POA: Diagnosis not present

## 2018-11-25 DIAGNOSIS — M79671 Pain in right foot: Secondary | ICD-10-CM | POA: Diagnosis not present

## 2018-11-25 DIAGNOSIS — E1140 Type 2 diabetes mellitus with diabetic neuropathy, unspecified: Secondary | ICD-10-CM | POA: Diagnosis not present

## 2018-11-25 DIAGNOSIS — M79672 Pain in left foot: Secondary | ICD-10-CM | POA: Diagnosis not present

## 2018-11-25 DIAGNOSIS — E1151 Type 2 diabetes mellitus with diabetic peripheral angiopathy without gangrene: Secondary | ICD-10-CM | POA: Diagnosis not present

## 2019-01-27 ENCOUNTER — Other Ambulatory Visit: Payer: Self-pay | Admitting: Cardiology

## 2019-03-08 ENCOUNTER — Encounter: Payer: Self-pay | Admitting: Cardiology

## 2019-03-08 DIAGNOSIS — K2100 Gastro-esophageal reflux disease with esophagitis, without bleeding: Secondary | ICD-10-CM | POA: Diagnosis not present

## 2019-03-08 DIAGNOSIS — E1165 Type 2 diabetes mellitus with hyperglycemia: Secondary | ICD-10-CM | POA: Diagnosis not present

## 2019-03-08 DIAGNOSIS — I10 Essential (primary) hypertension: Secondary | ICD-10-CM | POA: Diagnosis not present

## 2019-03-08 DIAGNOSIS — R946 Abnormal results of thyroid function studies: Secondary | ICD-10-CM | POA: Diagnosis not present

## 2019-03-08 DIAGNOSIS — E782 Mixed hyperlipidemia: Secondary | ICD-10-CM | POA: Diagnosis not present

## 2019-03-12 DIAGNOSIS — J301 Allergic rhinitis due to pollen: Secondary | ICD-10-CM | POA: Diagnosis not present

## 2019-03-12 DIAGNOSIS — E1169 Type 2 diabetes mellitus with other specified complication: Secondary | ICD-10-CM | POA: Diagnosis not present

## 2019-03-12 DIAGNOSIS — E782 Mixed hyperlipidemia: Secondary | ICD-10-CM | POA: Diagnosis not present

## 2019-03-12 DIAGNOSIS — I10 Essential (primary) hypertension: Secondary | ICD-10-CM | POA: Diagnosis not present

## 2019-03-12 DIAGNOSIS — E1165 Type 2 diabetes mellitus with hyperglycemia: Secondary | ICD-10-CM | POA: Diagnosis not present

## 2019-03-12 DIAGNOSIS — I2510 Atherosclerotic heart disease of native coronary artery without angina pectoris: Secondary | ICD-10-CM | POA: Diagnosis not present

## 2019-03-14 ENCOUNTER — Ambulatory Visit: Payer: BC Managed Care – PPO | Admitting: Cardiology

## 2019-04-04 ENCOUNTER — Ambulatory Visit: Payer: BC Managed Care – PPO | Admitting: Cardiology

## 2019-04-04 ENCOUNTER — Encounter: Payer: Self-pay | Admitting: Cardiology

## 2019-04-04 ENCOUNTER — Encounter: Payer: Self-pay | Admitting: *Deleted

## 2019-04-04 ENCOUNTER — Other Ambulatory Visit: Payer: Self-pay

## 2019-04-04 VITALS — BP 112/70 | HR 66 | Ht 72.0 in | Wt 226.0 lb

## 2019-04-04 DIAGNOSIS — I10 Essential (primary) hypertension: Secondary | ICD-10-CM

## 2019-04-04 DIAGNOSIS — E782 Mixed hyperlipidemia: Secondary | ICD-10-CM | POA: Diagnosis not present

## 2019-04-04 DIAGNOSIS — I25119 Atherosclerotic heart disease of native coronary artery with unspecified angina pectoris: Secondary | ICD-10-CM

## 2019-04-04 NOTE — Patient Instructions (Signed)

## 2019-04-04 NOTE — Progress Notes (Signed)
Cardiology Office Note  Date: 04/04/2019   ID: [name], DOB 13-Mar-1960, MRN [id number]  PCP:  Lawrence Chimera, MD  Cardiologist:  Lawrence Dell, MD Electrophysiologist:  None   Chief Complaint  Patient presents with  . Cardiac follow-up    History of Present Illness: Lawrence [name] is a 59 y.o. male last seen in August 2020.  He presents for a routine visit.  Since last assessment he is not report any significant angina symptoms or nitroglycerin use.  He is back working full-time.  I reviewed his medications which are outlined below.  He states that he has been compliant with dual antiplatelet therapy, has had a few nosebleeds, but no major bleeding episodes.  Still on high-dose Crestor and omega-3 supplements that were increased to 2000 mg twice daily at the last visit.  He states that he had recent lab work with PCP including lipid numbers which we will review.  It sounds like his type 2 diabetes mellitus remains poorly controlled, he recalls his most recent hemoglobin A1c being 11%.  I talked with him about asking Dr. Reuel [name] about referral to an endocrinologist.  I personally reviewed his ECG today which shows normal sinus rhythm, small R' in lead V1.  Past Medical History:  Diagnosis Date  . CAD (coronary artery disease)    a. DES circ/OM 5/11. b. Cath 02/2018 with diffuse mid LAD disease of up to 60-70%, severe focal in-stent restenosis of mid LCx stent at the proximal margin (90%), and 50% distal RCA stenosis as well as moderate diffuse rPDA disease -> s/p DES to ISR of mid LCx/OM1, normal LVEF.  Marland Kitchen Hyperlipidemia   . Hypertension   . Hypertriglyceridemia   . Type 2 diabetes mellitus (HCC)   . Wolff-Parkinson-White (WPW) syndrome    Status post  RFA (Lawrence [name])    Past Surgical History:  Procedure Laterality Date  . CORONARY STENT INTERVENTION N/A 03/04/2018   Procedure: CORONARY STENT INTERVENTION;  Surgeon: Lawrence Kendall, MD;  [facility];   Service: Cardiovascular;  Laterality: N/A;  . LEFT HEART CATH AND CORONARY ANGIOGRAPHY N/A 03/04/2018   Procedure: LEFT HEART CATH AND CORONARY ANGIOGRAPHY;  Surgeon: Lawrence Kendall, MD;  [facility];  Service: Cardiovascular;  Laterality: N/A;    Current Outpatient Medications  Medication Sig Dispense Refill  . aspirin EC 81 MG tablet Take 1 tablet (81 mg total) by mouth daily. 90 tablet 3  . clopidogrel (PLAVIX) 75 MG tablet Take 1 tablet (75 mg total) by mouth daily with breakfast. Continue this medication until instructed to stop. 30 tablet 11  . glipiZIDE (GLIPIZIDE XL) 10 MG 24 hr tablet Take 1 tablet (10 mg total) by mouth daily with breakfast.    . lisinopril-hydrochlorothiazide (PRINZIDE) 10-12.5 MG per tablet Take 1 tablet by mouth daily. 30 tablet 1  . metoprolol tartrate (LOPRESSOR) 50 MG tablet TAKE 1 TABLET(50 MG) BY MOUTH TWICE DAILY 180 tablet 3  . nitroGLYCERIN (NITROSTAT) 0.4 MG SL tablet Place 1 tablet (0.4 mg total) under the tongue every 5 (five) minutes x 3 doses as needed. 25 tablet 3  . Omega-3 Fatty Acids (FISH OIL) 1000 MG CAPS Take 2 capsules (2,000 mg total) by mouth 2 (two) times daily. 120 capsule 6  . rosuvastatin (CRESTOR) 40 MG tablet Take 1 tablet (40 mg total) by mouth daily. 90 tablet 3  . tetrahydrozoline-zinc (VISINE-AC) 0.05-0.25 % ophthalmic solution Place 2 drops into both eyes 3 (three) times  daily as needed (dry eyes).     No current facility-administered medications for this visit.   Allergies:  Patient has no known allergies.   ROS:   No palpitations or syncope.  Physical Exam: VS:  BP 112/70   Pulse 66   Ht 6' (1.829 m)   Wt 226 lb (102.5 kg)   SpO2 98%   BMI 30.65 kg/m , BMI Body mass index is 30.65 kg/m.  Wt Readings from Last 3 Encounters:  04/04/19 226 lb (102.5 kg)  09/11/18 215 lb (97.5 kg)  03/15/18 221 lb (100.2 kg)    General: Patient appears comfortable at rest. HEENT: Conjunctiva and lids normal, wearing  a mask. Neck: Supple, no elevated JVP or carotid bruits, no thyromegaly. Lungs: Clear to auscultation, nonlabored breathing at rest. Cardiac: Regular rate and rhythm, no S3 or significant systolic murmur. Abdomen: Soft, nontender, bowel sounds present, no guarding or rebound. Extremities: No pitting edema, distal pulses 2+.  ECG:  An ECG dated 03/15/2018 was personally reviewed today and demonstrated:  Sinus rhythm.  Recent Labwork:  February 2020: BUN 13, creatinine 0.86, potassium 4.6, AST 13, ALT 19, cholesterol 155, triglycerides 579, HDL 20, TSH 2.59, hemoglobin 14.4, platelets 261, hemoglobin A1c 11.8%  August 2020: AST 14, ALT 17, cholesterol 191, triglycerides 1133, HDL 20, LDL not calculated  Other Studies Reviewed Today:  Cardiac catheterization and PCI 03/04/2018: Conclusions: 1. Multivessel coronary artery disease, including diffuse mid LAD disease of up to 60-70%, severe focal in-stent restenosis of mid LCx stent at the proximal margin (90%), and 50% distal RCA stenosis as well as moderate diffuse rPDA disease. 2. Mid inferior hypokinesis with otherwise preserved left ventricular filling pressure and mildly elevated LVEDP. 3. Successful PCI to mid LCx/OM2 ISR using cutting balloon angioplasty and placement of Synergy 3.0 x 16 mm drug-eluting stent with 0% residual stenosis and TIMI-3 flow.  Recommendations: 1. Dual antiplatelet therapy with aspirin and clopidogrel for at least 6 months, ideally longer. 2. Aggressive secondary prevention. 3. Medical therapy of moderate to severe, non-critical LAD and RCA disease. 4. Same day discharge if no early post-cath complications.  Assessment and Plan:  1.  Multivessel CAD status post previous percutaneous coronary interventions.  He most recently underwent cutting balloon angioplasty and DES to the mid circumflex/OM 2 due to in-stent restenosis in February of last year.  Continue dual antiplatelet therapy as long as tolerated.  He is  also on Lopressor, Prinzide, and Crestor.  2.  Mixed hyperlipidemia.  Requesting recent lab work from PCP.  He remains on high-dose Crestor and omega-3 supplements 2000 mg twice daily.  3.  Poorly controlled type 2 diabetes mellitus, now on glipizide XL.  Consider referral to endocrinology per PCP.  Medication Adjustments/Labs and Tests Ordered: Current medicines are reviewed at length with the patient today.  Concerns regarding medicines are outlined above.   Tests Ordered: Orders Placed This Encounter  Procedures  . EKG 12-Lead    Medication Changes: No orders of the defined types were placed in this encounter.   Disposition:  Follow up 6 months in the [place] office.  Signed, Satira Sark, MD, [facility] 04/04/2019 5:08 PM    [facility] [place], [place], [place] [address] Phone: [phone]; Fax: [phone]

## 2019-04-09 ENCOUNTER — Telehealth: Payer: Self-pay | Admitting: *Deleted

## 2019-04-09 NOTE — Telephone Encounter (Signed)
-----   Message from Jonelle Sidle, MD sent at 04/09/2019 12:40 PM EDT ----- Results reviewed.  Last lipid panel showed triglycerides 437 which is down from greater than 1100, LDL is 57.  Continue with current regimen and work on better glucose control as we discussed at recent visit.

## 2019-04-09 NOTE — Telephone Encounter (Signed)
Pt voiced understanding - routed to pcp  

## 2019-04-30 ENCOUNTER — Other Ambulatory Visit: Payer: Self-pay | Admitting: Physician Assistant

## 2019-07-17 DIAGNOSIS — Z0001 Encounter for general adult medical examination with abnormal findings: Secondary | ICD-10-CM | POA: Diagnosis not present

## 2019-07-17 DIAGNOSIS — N400 Enlarged prostate without lower urinary tract symptoms: Secondary | ICD-10-CM | POA: Diagnosis not present

## 2019-07-17 DIAGNOSIS — E1165 Type 2 diabetes mellitus with hyperglycemia: Secondary | ICD-10-CM | POA: Diagnosis not present

## 2019-07-17 DIAGNOSIS — E782 Mixed hyperlipidemia: Secondary | ICD-10-CM | POA: Diagnosis not present

## 2019-07-17 DIAGNOSIS — K2100 Gastro-esophageal reflux disease with esophagitis, without bleeding: Secondary | ICD-10-CM | POA: Diagnosis not present

## 2019-07-17 DIAGNOSIS — I10 Essential (primary) hypertension: Secondary | ICD-10-CM | POA: Diagnosis not present

## 2019-07-22 DIAGNOSIS — E782 Mixed hyperlipidemia: Secondary | ICD-10-CM | POA: Diagnosis not present

## 2019-07-22 DIAGNOSIS — E1169 Type 2 diabetes mellitus with other specified complication: Secondary | ICD-10-CM | POA: Diagnosis not present

## 2019-07-22 DIAGNOSIS — I10 Essential (primary) hypertension: Secondary | ICD-10-CM | POA: Diagnosis not present

## 2019-07-22 DIAGNOSIS — Z1212 Encounter for screening for malignant neoplasm of rectum: Secondary | ICD-10-CM | POA: Diagnosis not present

## 2019-07-22 DIAGNOSIS — Z0001 Encounter for general adult medical examination with abnormal findings: Secondary | ICD-10-CM | POA: Diagnosis not present

## 2019-07-22 DIAGNOSIS — I2510 Atherosclerotic heart disease of native coronary artery without angina pectoris: Secondary | ICD-10-CM | POA: Diagnosis not present

## 2019-08-25 DIAGNOSIS — M25562 Pain in left knee: Secondary | ICD-10-CM | POA: Diagnosis not present

## 2019-08-25 DIAGNOSIS — M25552 Pain in left hip: Secondary | ICD-10-CM | POA: Diagnosis not present

## 2019-08-25 DIAGNOSIS — Z6827 Body mass index (BMI) 27.0-27.9, adult: Secondary | ICD-10-CM | POA: Diagnosis not present

## 2019-08-28 DIAGNOSIS — M25562 Pain in left knee: Secondary | ICD-10-CM | POA: Diagnosis not present

## 2019-08-28 DIAGNOSIS — M545 Low back pain: Secondary | ICD-10-CM | POA: Diagnosis not present

## 2019-08-28 DIAGNOSIS — M25552 Pain in left hip: Secondary | ICD-10-CM | POA: Diagnosis not present

## 2019-08-28 DIAGNOSIS — S29019A Strain of muscle and tendon of unspecified wall of thorax, initial encounter: Secondary | ICD-10-CM | POA: Diagnosis not present

## 2019-09-09 DIAGNOSIS — Z20828 Contact with and (suspected) exposure to other viral communicable diseases: Secondary | ICD-10-CM | POA: Diagnosis not present

## 2019-09-17 DIAGNOSIS — Z20828 Contact with and (suspected) exposure to other viral communicable diseases: Secondary | ICD-10-CM | POA: Diagnosis not present

## 2019-10-15 DIAGNOSIS — Z23 Encounter for immunization: Secondary | ICD-10-CM | POA: Diagnosis not present

## 2019-10-27 ENCOUNTER — Other Ambulatory Visit: Payer: Self-pay | Admitting: Cardiology

## 2019-11-19 DIAGNOSIS — E782 Mixed hyperlipidemia: Secondary | ICD-10-CM | POA: Diagnosis not present

## 2019-11-19 DIAGNOSIS — K2100 Gastro-esophageal reflux disease with esophagitis, without bleeding: Secondary | ICD-10-CM | POA: Diagnosis not present

## 2019-11-19 DIAGNOSIS — E1165 Type 2 diabetes mellitus with hyperglycemia: Secondary | ICD-10-CM | POA: Diagnosis not present

## 2019-11-19 DIAGNOSIS — R946 Abnormal results of thyroid function studies: Secondary | ICD-10-CM | POA: Diagnosis not present

## 2019-11-24 DIAGNOSIS — J301 Allergic rhinitis due to pollen: Secondary | ICD-10-CM | POA: Diagnosis not present

## 2019-11-24 DIAGNOSIS — I2510 Atherosclerotic heart disease of native coronary artery without angina pectoris: Secondary | ICD-10-CM | POA: Diagnosis not present

## 2019-11-24 DIAGNOSIS — E782 Mixed hyperlipidemia: Secondary | ICD-10-CM | POA: Diagnosis not present

## 2019-11-24 DIAGNOSIS — I10 Essential (primary) hypertension: Secondary | ICD-10-CM | POA: Diagnosis not present

## 2020-01-25 ENCOUNTER — Other Ambulatory Visit: Payer: Self-pay | Admitting: Cardiology

## 2020-02-25 NOTE — Progress Notes (Signed)
Cardiology Office Note  Date: 02/26/2020   ID: Verland, Sprinkle 1960/09/30, MRN [id number]  PCP:  Richardean Chimera, MD  Cardiologist:  Nona Dell, MD Electrophysiologist:  None   Chief Complaint  Patient presents with  . Cardiac follow-up    History of Present Illness: Lawrence [name] is a 60 y.o. male last seen in March 2021.  He presents for a routine follow-up visit.  Since last encounter he does not report any recurrent angina symptoms or nitroglycerin use.  He is working swing shifts at this time, having trouble getting used to it.  He had previously been on days for 4 years.  We went over his medications which are outlined below and stable from a cardiac perspective.  He did see Dr. Reuel Boom recently and had lab work which we are requesting for review.  I personally reviewed his ECG today which shows sinus rhythm with prolonged PR interval.  Past Medical History:  Diagnosis Date  . CAD (coronary artery disease)    a. DES circ/OM 5/11. b. Cath 02/2018 with diffuse mid LAD disease of up to 60-70%, severe focal in-stent restenosis of mid LCx stent at the proximal margin (90%), and 50% distal RCA stenosis as well as moderate diffuse rPDA disease -> s/p DES to ISR of mid LCx/OM1, normal LVEF.  Marland Kitchen Hyperlipidemia   . Hypertension   . Hypertriglyceridemia   . Type 2 diabetes mellitus (HCC)   . Wolff-Parkinson-White (WPW) syndrome    Status post  RFA (Dr. Graciela Husbands)    Past Surgical History:  Procedure Laterality Date  . CORONARY STENT INTERVENTION N/A 03/04/2018   Procedure: CORONARY STENT INTERVENTION;  Surgeon: Yvonne Kendall, MD;  [facility];  Service: Cardiovascular;  Laterality: N/A;  . LEFT HEART CATH AND CORONARY ANGIOGRAPHY N/A 03/04/2018   Procedure: LEFT HEART CATH AND CORONARY ANGIOGRAPHY;  Surgeon: Yvonne Kendall, MD;  [facility];  Service: Cardiovascular;  Laterality: N/A;    Current Outpatient Medications  Medication Sig  Dispense Refill  . aspirin EC 81 MG tablet Take 1 tablet (81 mg total) by mouth daily. 90 tablet 3  . glipiZIDE (GLIPIZIDE XL) 10 MG 24 hr tablet Take 1 tablet (10 mg total) by mouth daily with breakfast.    . lisinopril-hydrochlorothiazide (PRINZIDE) 10-12.5 MG per tablet Take 1 tablet by mouth daily. 30 tablet 1  . metoprolol tartrate (LOPRESSOR) 50 MG tablet TAKE 1 TABLET(50 MG) BY MOUTH TWICE DAILY 180 tablet 3  . nitroGLYCERIN (NITROSTAT) 0.4 MG SL tablet Place 1 tablet (0.4 mg total) under the tongue every 5 (five) minutes x 3 doses as needed. 25 tablet 3  . Omega-3 Fatty Acids (FISH OIL) 1000 MG CAPS Take 2 capsules (2,000 mg total) by mouth 2 (two) times daily. 120 capsule 6  . rosuvastatin (CRESTOR) 40 MG tablet TAKE 1 TABLET(40 MG) BY MOUTH DAILY 90 tablet 0  . tetrahydrozoline-zinc (VISINE-AC) 0.05-0.25 % ophthalmic solution Place 2 drops into both eyes 3 (three) times daily as needed (dry eyes).     No current facility-administered medications for this visit.   Allergies:  Patient has no known allergies.   ROS: No palpitations or syncope.  Physical Exam: VS:  BP (!) 110/58   Pulse 76   Ht 6' (1.829 m)   Wt 216 lb (98 kg)   SpO2 98%   BMI 29.29 kg/m , BMI Body mass index is 29.29 kg/m.  Wt Readings from Last 3 Encounters:  02/26/20  216 lb (98 kg)  04/04/19 226 lb (102.5 kg)  09/11/18 215 lb (97.5 kg)    General: Patient appears comfortable at rest. HEENT: Conjunctiva and lids normal, wearing a mask. Neck: Supple, no elevated JVP or carotid bruits, no thyromegaly. Lungs: Clear to auscultation, nonlabored breathing at rest. Cardiac: Regular rate and rhythm, no S3 or significant systolic murmur, no pericardial rub. Extremities: No pitting edema.  ECG:  An ECG dated 04/04/2019 was personally reviewed today and demonstrated:  Sinus rhythm with small R' in lead V1.  Recent Labwork:  February 2021: Cholesterol 150, triglycerides 437, HDL 26, LDL 57  Other Studies  Reviewed Today:  Cardiac catheterization and PCI 03/04/2018: Conclusions: 1. Multivessel coronary artery disease, including diffuse mid LAD disease of up to 60-70%, severe focal in-stent restenosis of mid LCx stent at the proximal margin (90%), and 50% distal RCA stenosis as well as moderate diffuse rPDA disease. 2. Mid inferior hypokinesis with otherwise preserved left ventricular filling pressure and mildly elevated LVEDP. 3. Successful PCI to mid LCx/OM2 ISR using cutting balloon angioplasty and placement of Synergy 3.0 x 16 mm drug-eluting stent with 0% residual stenosis and TIMI-3 flow.  Recommendations: 1. Dual antiplatelet therapy with aspirin and clopidogrel for at least 6 months, ideally longer. 2. Aggressive secondary prevention. 3. Medical therapy of moderate to severe, non-critical LAD and RCA disease. 4. Same day discharge if no early post-cath complications.  Assessment and Plan:  1.  Multivessel CAD status post previous PCI, last intervention was Cutting Balloon angioplasty and DES to the mid circumflex/OM 2 in the setting of in-stent restenosis February 2020.  No longer on dual antiplatelet therapy.  He is tolerating aspirin, Prinzide, Lopressor, Crestor, omega-3 supplements, and as needed nitroglycerin.  2.  Mixed hyperlipidemia, on Crestor and omega-3 supplements.  Requesting most recent lab work from Dr. Reuel Boom.  3.  Uncontrolled type 2 diabetes mellitus, followed by Dr. Reuel Boom.  Currently on glipizide XL and recently started on Ozempic.  Medication Adjustments/Labs and Tests Ordered: Current medicines are reviewed at length with the patient today.  Concerns regarding medicines are outlined above.   Tests Ordered: Orders Placed This Encounter  Procedures  . EKG 12-Lead    Medication Changes: No orders of the defined types were placed in this encounter.   Disposition:  Follow up 6 months in the [place] office.  Signed, Jonelle Sidle, MD, [facility] 02/26/2020 8:48  AM    [facility] at [facility] [address] [place], [place], [place] [address] Phone: [phone]; Fax: [phone]

## 2020-02-26 ENCOUNTER — Encounter: Payer: Self-pay | Admitting: Cardiology

## 2020-02-26 ENCOUNTER — Encounter: Payer: Self-pay | Admitting: *Deleted

## 2020-02-26 ENCOUNTER — Ambulatory Visit: Payer: BC Managed Care – PPO | Admitting: Cardiology

## 2020-02-26 VITALS — BP 110/58 | HR 76 | Ht 72.0 in | Wt 216.0 lb

## 2020-02-26 DIAGNOSIS — E782 Mixed hyperlipidemia: Secondary | ICD-10-CM | POA: Diagnosis not present

## 2020-02-26 DIAGNOSIS — I25119 Atherosclerotic heart disease of native coronary artery with unspecified angina pectoris: Secondary | ICD-10-CM

## 2020-02-26 NOTE — Patient Instructions (Signed)

## 2020-09-07 NOTE — Progress Notes (Signed)
Cardiology Office Note  Date: 09/08/2020   ID: Lawrence, [name] 04/08/1960, MRN [id number]  PCP:  Richardean Chimera, MD  Cardiologist:  Nona Dell, MD Electrophysiologist:  None   Chief Complaint  Patient presents with   Cardiac follow-up    History of Present Illness: Lawrence [name] is a 60 y.o. male last seen in February.  He is here today for a follow-up visit.  He does not report any active angina or nitroglycerin use.  Still working full-time.  He has cut Crestor back to 20 mg every other day with follow-up by Dr. Reuel Boom.  Was having joint pain on higher dose.  His last LDL was 60 in February.  I reviewed his medications which are outlined below.  He does need a refill for fresh bottle of nitroglycerin.  He states that his blood sugars have come under better control since being on Ozempic.  Past Medical History:  Diagnosis Date   CAD (coronary artery disease)    a. DES circ/OM 5/11. b. Cath 02/2018 with diffuse mid LAD disease of up to 60-70%, severe focal in-stent restenosis of mid LCx stent at the proximal margin (90%), and 50% distal RCA stenosis as well as moderate diffuse rPDA disease -> s/p DES to ISR of mid LCx/OM1, normal LVEF.   Hyperlipidemia    Hypertension    Hypertriglyceridemia    Type 2 diabetes mellitus (HCC)    Wolff-Parkinson-White (WPW) syndrome    Status post  RFA (Dr. Graciela Husbands)    Past Surgical History:  Procedure Laterality Date   CORONARY STENT INTERVENTION N/A 03/04/2018   Procedure: CORONARY STENT INTERVENTION;  Surgeon: Yvonne Kendall, MD;  [facility];  Service: Cardiovascular;  Laterality: N/A;   LEFT HEART CATH AND CORONARY ANGIOGRAPHY N/A 03/04/2018   Procedure: LEFT HEART CATH AND CORONARY ANGIOGRAPHY;  Surgeon: Yvonne Kendall, MD;  [facility];  Service: Cardiovascular;  Laterality: N/A;    Current Outpatient Medications  Medication Sig Dispense Refill   aspirin EC 81 MG tablet Take 1 tablet (81 mg  total) by mouth daily. 90 tablet 3   glipiZIDE (GLIPIZIDE XL) 10 MG 24 hr tablet Take 1 tablet (10 mg total) by mouth daily with breakfast.     lisinopril-hydrochlorothiazide (PRINZIDE) 10-12.5 MG per tablet Take 1 tablet by mouth daily. 30 tablet 1   metoprolol tartrate (LOPRESSOR) 50 MG tablet TAKE 1 TABLET(50 MG) BY MOUTH TWICE DAILY 180 tablet 3   Omega-3 Fatty Acids (FISH OIL) 1000 MG CAPS Take 2 capsules (2,000 mg total) by mouth 2 (two) times daily. 120 capsule 6   omeprazole (PRILOSEC) 40 MG capsule Take 40 mg by mouth daily.     OZEMPIC, 1 MG/DOSE, 4 MG/3ML SOPN Inject 1 mg into the skin once a week.     rosuvastatin (CRESTOR) 20 MG tablet Take 20 mg by mouth every other day.     tetrahydrozoline-zinc (VISINE-AC) 0.05-0.25 % ophthalmic solution Place 2 drops into both eyes 3 (three) times daily as needed (dry eyes).     nitroGLYCERIN (NITROSTAT) 0.4 MG SL tablet Place 1 tablet (0.4 mg total) under the tongue every 5 (five) minutes x 3 doses as needed. 25 tablet 3   No current facility-administered medications for this visit.   Allergies:  Patient has no known allergies.   ROS: No palpitations or syncope.  Physical Exam: VS:  BP 122/74   Pulse 72   Ht 6' (1.829 m)   Wt 209  lb (94.8 kg)   SpO2 99%   BMI 28.35 kg/m , BMI Body mass index is 28.35 kg/m.  Wt Readings from Last 3 Encounters:  09/08/20 209 lb (94.8 kg)  02/26/20 216 lb (98 kg)  04/04/19 226 lb (102.5 kg)    General: Patient appears comfortable at rest. HEENT: Conjunctiva and lids normal, wearing a mask. Neck: Supple, no elevated JVP or carotid bruits, no thyromegaly. Lungs: Clear to auscultation, nonlabored breathing at rest. Cardiac: Regular rate and rhythm, no S3 or significant systolic murmur, no pericardial rub. Extremities: No pitting edema.  ECG:  An ECG dated 02/26/2020 was personally reviewed today and demonstrated:  Sinus rhythm with prolonged PR interval.  Recent Labwork:  February 2022: TSH 1.51,  hemoglobin A1c 10.8%, cholesterol 151, triglycerides 447, LDL 60  Other Studies Reviewed Today:  No interval cardiac testing for review today.  Assessment and Plan:  1.  Multivessel CAD status post previous percutaneous coronary intervention, last being Cutting Balloon angioplasty and DES to the mid circumflex/OM 2 for management of in-stent restenosis in February 2020.  He does not report any progressive angina and we will continue to follow him expectantly on medical therapy.  Refill provided for fresh bottle of nitroglycerin.  Continue aspirin, Prinzide, Lopressor, and Crestor.  2.  Mixed hyperlipidemia, Crestor now at 20 mg every other day.  Continue to track lipids with PCP.  Previous LDL was at goal.  3.  Type 2 diabetes mellitus, coming coming under better control.  He is now on glipizide XL and Ozempic with followed by Dr. Reuel Boom.  Medication Adjustments/Labs and Tests Ordered: Current medicines are reviewed at length with the patient today.  Concerns regarding medicines are outlined above.   Tests Ordered: No orders of the defined types were placed in this encounter.   Medication Changes: Meds ordered this encounter  Medications   nitroGLYCERIN (NITROSTAT) 0.4 MG SL tablet    Sig: Place 1 tablet (0.4 mg total) under the tongue every 5 (five) minutes x 3 doses as needed.    Dispense:  25 tablet    Refill:  3     Disposition:  Follow up  6 months.  Signed, Jonelle Sidle, MD, [facility] 09/08/2020 9:20 AM    [facility] at [facility] [address] [place], [place], [place] [address] Phone: [phone]; Fax: [phone]

## 2020-09-08 ENCOUNTER — Encounter: Payer: Self-pay | Admitting: Cardiology

## 2020-09-08 ENCOUNTER — Ambulatory Visit: Payer: BC Managed Care – PPO | Admitting: Cardiology

## 2020-09-08 VITALS — BP 122/74 | HR 72 | Ht 72.0 in | Wt 209.0 lb

## 2020-09-08 DIAGNOSIS — E782 Mixed hyperlipidemia: Secondary | ICD-10-CM

## 2020-09-08 DIAGNOSIS — I25119 Atherosclerotic heart disease of native coronary artery with unspecified angina pectoris: Secondary | ICD-10-CM

## 2020-09-08 MED ORDER — NITROGLYCERIN 0.4 MG SL SUBL: 0.4000 mg | SUBLINGUAL_TABLET | SUBLINGUAL | 3 refills | Status: DC | PRN

## 2020-09-08 NOTE — Patient Instructions (Addendum)

## 2020-11-20 LAB — EXTERNAL GENERIC LAB PROCEDURE: COLOGUARD: NEGATIVE

## 2020-11-20 LAB — COLOGUARD: COLOGUARD: NEGATIVE

## 2021-01-19 ENCOUNTER — Other Ambulatory Visit: Payer: Self-pay | Admitting: Cardiology

## 2021-05-04 NOTE — Progress Notes (Signed)
? ? ?Cardiology Office Note ? ?Date: 05/05/2021  ? ?ID: Lawrence [name], DOB [birth year], MRN [id number] ? ?PCP:  Caryl Bis, MD  ?Cardiologist:  Rozann Lesches, MD ?Electrophysiologist:  None  ? ?Chief Complaint  ?Patient presents with  ? Cardiac follow-up  ? ? ?History of Present Illness: ?Lawrence [name] is a 61 y.o. male last seen in August 2022.  He is here for a follow-up visit.  Doing well without any significant angina or nitroglycerin use on current regimen.  NYHA class II dyspnea, no change in stamina. ? ?I reviewed his ECG today which shows sinus rhythm with prolonged PR interval. ? ?I went over his medications as well, stable from a cardiac perspective.  He continues to follow with Dr. Quillian Quince. ? ?Past Medical History:  ?Diagnosis Date  ? CAD (coronary artery disease)   ? a. DES circ/OM 5/11. b. Cath 02/2018 with diffuse mid LAD disease of up to 60-70%, severe focal in-stent restenosis of mid LCx stent at the proximal margin (90%), and 50% distal RCA stenosis as well as moderate diffuse rPDA disease -> s/p DES to ISR of mid LCx/OM1, normal LVEF.  ? Hyperlipidemia   ? Hypertension   ? Hypertriglyceridemia   ? Type 2 diabetes mellitus ([place])   ? Wolff-Parkinson-White (WPW) syndrome   ? Status post  RFA (Dr. Caryl Comes)  ? ? ?Past Surgical History:  ?Procedure Laterality Date  ? CORONARY STENT INTERVENTION N/A 03/04/2018  ? Procedure: CORONARY STENT INTERVENTION;  Surgeon: Nelva Bush, MD;  Location: [place] [facility];  Service: Cardiovascular;  Laterality: N/A;  ? LEFT HEART CATH AND CORONARY ANGIOGRAPHY N/A 03/04/2018  ? Procedure: LEFT HEART CATH AND CORONARY ANGIOGRAPHY;  Surgeon: Nelva Bush, MD;  Location: [place] CV [facility];  Service: Cardiovascular;  Laterality: N/A;  ? ? ?Current Outpatient Medications  ?Medication Sig Dispense Refill  ? glipiZIDE (GLIPIZIDE XL) 10 MG 24 hr tablet Take 1 tablet (10 mg total) by mouth daily with breakfast.    ? lisinopril-hydrochlorothiazide (PRINZIDE) 10-12.5 MG  per tablet Take 1 tablet by mouth daily. 30 tablet 1  ? metoprolol tartrate (LOPRESSOR) 50 MG tablet TAKE 1 TABLET(50 MG) BY MOUTH TWICE DAILY 180 tablet 3  ? nitroGLYCERIN (NITROSTAT) 0.4 MG SL tablet Place 1 tablet (0.4 mg total) under the tongue every 5 (five) minutes x 3 doses as needed. 25 tablet 3  ? omeprazole (PRILOSEC) 40 MG capsule Take 40 mg by mouth daily.    ? OZEMPIC, 1 MG/DOSE, 4 MG/3ML SOPN Inject 1 mg into the skin once a week.    ? rosuvastatin (CRESTOR) 20 MG tablet Take 20 mg by mouth every other day.    ? tetrahydrozoline-zinc (VISINE-AC) 0.05-0.25 % ophthalmic solution Place 2 drops into both eyes 3 (three) times daily as needed (dry eyes).    ? aspirin EC 81 MG tablet Take 1 tablet (81 mg total) by mouth daily. (Patient not taking: Reported on 05/05/2021) 90 tablet 3  ? Omega-3 Fatty Acids (FISH OIL) 1000 MG CAPS Take 2 capsules (2,000 mg total) by mouth 2 (two) times daily. (Patient not taking: Reported on 05/05/2021) 120 capsule 6  ? ?No current facility-administered medications for this visit.  ? ?Allergies:  Patient has no known allergies.  ? ?ROS:  No syncope. ? ?Physical Exam: ?VS:  BP 116/68   Pulse 82   Ht 6' (1.829 m)   Wt 213 lb 6.4 oz (96.8 kg)   SpO2 96%   BMI 28.94 kg/m? , BMI  Body mass index is 28.94 kg/m?. ? ?Wt Readings from Last 3 Encounters:  ?05/05/21 213 lb 6.4 oz (96.8 kg)  ?09/08/20 209 lb (94.8 kg)  ?02/26/20 216 lb (98 kg)  ?  ?General: Patient appears comfortable at rest. ?HEENT: Conjunctiva and lids normal. ?Neck: Supple, no elevated JVP or carotid bruits, no thyromegaly. ?Lungs: Clear to auscultation, nonlabored breathing at rest. ?Cardiac: Regular rate and rhythm, no S3 or significant systolic murmur. ?Extremities: No pitting edema. ? ?ECG:  An ECG dated 02/26/2020 was personally reviewed today and demonstrated:  Sinus rhythm with prolonged PR interval. ? ?Recent Labwork: ? ?February 2022: TSH 1.51, hemoglobin A1c 10.8%, cholesterol 151, triglycerides 447, LDL  60 ? ?Other Studies Reviewed Today: ? ?No interval cardiac testing for review. ? ?Assessment and Plan: ? ?1.  Multivessel CAD status post previous PCI, most recently Cutting Balloon angioplasty and DES to the mid circumflex/OM 2 for management of in-stent restenosis back in February 2020.  Still doing well without active angina on medical therapy.  ECG reviewed and stable.  Continue aspirin, Prinzide, Lopressor, Crestor, and as needed nitroglycerin. ? ?2.  Mixed hyperlipidemia on Crestor, continues to follow with Dr. Quillian Quince.  Last LDL was 60. ? ?3.  Type 2 diabetes mellitus.  He is on glipizide XL and Ozempic with follow-up by Dr. Quillian Quince. ? ?Medication Adjustments/Labs and Tests Ordered: ?Current medicines are reviewed at length with the patient today.  Concerns regarding medicines are outlined above.  ? ?Tests Ordered: ?Orders Placed This Encounter  ?Procedures  ? EKG 12-Lead  ? ? ?Medication Changes: ?No orders of the defined types were placed in this encounter. ? ? ?Disposition:  Follow up  6 months. ? ?Signed, ?Satira Sark, MD, [facility] ?05/05/2021 10:09 AM    ?[place] at [facility] ?[place], [place], [place] [address] ?Phone: [phone]; Fax: [phone]  ?

## 2021-05-05 ENCOUNTER — Encounter: Payer: Self-pay | Admitting: Cardiology

## 2021-05-05 ENCOUNTER — Ambulatory Visit: Payer: BC Managed Care – PPO | Admitting: Cardiology

## 2021-05-05 VITALS — BP 116/68 | HR 82 | Ht 72.0 in | Wt 213.4 lb

## 2021-05-05 DIAGNOSIS — E782 Mixed hyperlipidemia: Secondary | ICD-10-CM | POA: Diagnosis not present

## 2021-05-05 DIAGNOSIS — E1165 Type 2 diabetes mellitus with hyperglycemia: Secondary | ICD-10-CM | POA: Diagnosis not present

## 2021-05-05 DIAGNOSIS — I25119 Atherosclerotic heart disease of native coronary artery with unspecified angina pectoris: Secondary | ICD-10-CM | POA: Diagnosis not present

## 2021-05-05 NOTE — Patient Instructions (Addendum)

## 2021-09-13 ENCOUNTER — Encounter: Payer: Self-pay | Admitting: Cardiology

## 2021-11-30 ENCOUNTER — Ambulatory Visit: Payer: BC Managed Care – PPO | Admitting: Cardiology

## 2021-12-01 NOTE — Progress Notes (Signed)
Cardiology Office Note  Date: 12/02/2021   ID: Lawrence [name], DOB 01/30/1960, MRN [id number]  PCP:  Richardean Chimera, MD  Cardiologist:  Nona Dell, MD Electrophysiologist:  None   Chief Complaint  Patient presents with   Cardiac follow-up    History of Present Illness: Lawrence [name] is a 61 y.o. male last seen in April.  He is here for a routine visit.  Continues to work full-time at [employer]  8-hour shifts now.  Reports no angina or nitroglycerin use, stable NYHA class II dyspnea.  No palpitations or syncope.  I reviewed his medications.  He reports no intolerances.  He did have lab work back in June at which point LDL was 74.  He continues to follow with Dr. Reuel Boom regularly.  Lexiscan Myoview from February 2020 revealed LVEF 62% with small ischemic territories in the mid anterior and basal inferior/inferoseptal region.  He has been symptomatically stable on medical therapy and we have continued with observation.  Past Medical History:  Diagnosis Date   CAD (coronary artery disease)    a. DES circ/OM 5/11. b. Cath 02/2018 with diffuse mid LAD disease of up to 60-70%, severe focal in-stent restenosis of mid LCx stent at the proximal margin (90%), and 50% distal RCA stenosis as well as moderate diffuse rPDA disease -> s/p DES to ISR of mid LCx/OM1, normal LVEF.   Hyperlipidemia    Hypertension    Hypertriglyceridemia    Type 2 diabetes mellitus (HCC)    Wolff-Parkinson-White (WPW) syndrome    Status post  RFA (Dr. Graciela Husbands)    Past Surgical History:  Procedure Laterality Date   CORONARY STENT INTERVENTION N/A 03/04/2018   Procedure: CORONARY STENT INTERVENTION;  Surgeon: Yvonne Kendall, MD;  Location: [facility];  Service: Cardiovascular;  Laterality: N/A;   LEFT HEART CATH AND CORONARY ANGIOGRAPHY N/A 03/04/2018   Procedure: LEFT HEART CATH AND CORONARY ANGIOGRAPHY;  Surgeon: Yvonne Kendall, MD;  Location: [facility];  Service: Cardiovascular;   Laterality: N/A;    Current Outpatient Medications  Medication Sig Dispense Refill   glipiZIDE (GLIPIZIDE XL) 10 MG 24 hr tablet Take 1 tablet (10 mg total) by mouth daily with breakfast.     lisinopril-hydrochlorothiazide (PRINZIDE) 10-12.5 MG per tablet Take 1 tablet by mouth daily. 30 tablet 1   metoprolol tartrate (LOPRESSOR) 50 MG tablet TAKE 1 TABLET(50 MG) BY MOUTH TWICE DAILY 180 tablet 3   nitroGLYCERIN (NITROSTAT) 0.4 MG SL tablet Place 1 tablet (0.4 mg total) under the tongue every 5 (five) minutes x 3 doses as needed. 25 tablet 3   omeprazole (PRILOSEC) 40 MG capsule Take 40 mg by mouth daily.     OZEMPIC, 1 MG/DOSE, 4 MG/3ML SOPN Inject 1 mg into the skin once a week.     rosuvastatin (CRESTOR) 20 MG tablet Take 20 mg by mouth every other day.     tetrahydrozoline-zinc (VISINE-AC) 0.05-0.25 % ophthalmic solution Place 2 drops into both eyes 3 (three) times daily as needed (dry eyes).     No current facility-administered medications for this visit.   Allergies:  Patient has no known allergies.   ROS: No palpitations or syncope.  Physical Exam: VS:  BP 128/62 (BP Location: Right Arm, Patient Position: Sitting, Cuff Size: Large)   Pulse 78   Ht 6' (1.829 m)   Wt 218 lb (98.9 kg)   BMI 29.57 kg/m , BMI Body mass index is 29.57 kg/m.  Wt Readings from Last  3 Encounters:  12/02/21 218 lb (98.9 kg)  05/05/21 213 lb 6.4 oz (96.8 kg)  09/08/20 209 lb (94.8 kg)    General: Patient appears comfortable at rest. HEENT: Conjunctiva and lids normal. Neck: Supple, no elevated JVP or carotid bruits. Lungs: Clear to auscultation, nonlabored breathing at rest. Cardiac: Regular rate and rhythm, no S3 or significant systolic murmur, no pericardial rub. Extremities: No pitting edema.  ECG:  An ECG dated 05/05/2021 was personally reviewed today and demonstrated:  Sinus rhythm with prolonged PR interval.  Recent Labwork:  June 2023: BUN 17, creatinine 1.03, potassium 4.2, AST 14, ALT  14, cholesterol 171, triglycerides 460, HDL 24, LDL 74, TSH 2.5  Other Studies Reviewed Today:  Lexiscan Myoview 02/22/2018: No diagnostic ST segment changes to indicate ischemia. Small, mild intensity, mid anterior defect that is reversible and consistent with a small ischemic territory. Also basal inferior/inferoseptal defect that is partially reversible and suggestive of ischemia. This is an intermediate risk study. Nuclear stress EF: 62%.  Assessment and Plan:  1.  Multivessel CAD status post previous PCI as discussed above.  Angiography in 2020 revealed moderate mid LAD disease managed medically, ISR of the mid circumflex stent site requiring repeat DES, and moderate RCA/PDA disease managed medically.  He reports no angina on medical therapy and we will continue with observation. Continue aspirin, Lopressor, Prinzide, Ozempic, Crestor, and as needed nitroglycerin.  2.  Mixed hyperlipidemia on Crestor.  Last LDL 74.  Keep follow-up with Dr. Reuel Boom.  Medication Adjustments/Labs and Tests Ordered: Current medicines are reviewed at length with the patient today.  Concerns regarding medicines are outlined above.   Tests Ordered: No orders of the defined types were placed in this encounter.   Medication Changes: No orders of the defined types were placed in this encounter.   Disposition:  Follow up  6 months.  Signed, Jonelle Sidle, MD, [facility] 12/02/2021 8:34 AM    [facility] at [facility] [address] [place], [place], [place] [address] Phone: [phone]; Fax: [phone]

## 2021-12-02 ENCOUNTER — Encounter: Payer: Self-pay | Admitting: Cardiology

## 2021-12-02 ENCOUNTER — Ambulatory Visit: Payer: BC Managed Care – PPO | Attending: Cardiology | Admitting: Cardiology

## 2021-12-02 VITALS — BP 128/62 | HR 78 | Ht 72.0 in | Wt 218.0 lb

## 2021-12-02 DIAGNOSIS — E782 Mixed hyperlipidemia: Secondary | ICD-10-CM

## 2021-12-02 DIAGNOSIS — I25119 Atherosclerotic heart disease of native coronary artery with unspecified angina pectoris: Secondary | ICD-10-CM | POA: Diagnosis not present

## 2021-12-02 MED ORDER — ASPIRIN 81 MG PO TBEC: 81.0000 mg | DELAYED_RELEASE_TABLET | Freq: Every day | ORAL | Status: AC

## 2021-12-02 NOTE — Patient Instructions (Signed)
Medication Instructions:  Continue all current medications.   Labwork: none  Testing/Procedures: none  Follow-Up: 6 months   Any Other Special Instructions Will Be Listed Below (If Applicable).   If you need a refill on your cardiac medications before your next appointment, please call your pharmacy.  

## 2022-01-03 ENCOUNTER — Other Ambulatory Visit: Payer: Self-pay | Admitting: Cardiology

## 2022-06-25 NOTE — Progress Notes (Unsigned)
    Cardiology Office Note  Date: 06/25/2022   ID: DELIO SLATES, DOB 02/04/1960, MRN [id number]  History of Present Illness: Lawrence [name] is a 62 y.o. male last seen in November 2023.  Physical Exam: VS:  There were no vitals taken for this visit., BMI There is no height or weight on file to calculate BMI.  Wt Readings from Last 3 Encounters:  12/02/21 218 lb (98.9 kg)  05/05/21 213 lb 6.4 oz (96.8 kg)  09/08/20 209 lb (94.8 kg)    General: Patient appears comfortable at rest. HEENT: Conjunctiva and lids normal, oropharynx clear with moist mucosa. Neck: Supple, no elevated JVP or carotid bruits, no thyromegaly. Lungs: Clear to auscultation, nonlabored breathing at rest. Cardiac: Regular rate and rhythm, no S3 or significant systolic murmur, no pericardial rub. Abdomen: Soft, nontender, no hepatomegaly, bowel sounds present, no guarding or rebound. Extremities: No pitting edema, distal pulses 2+. Skin: Warm and dry. Musculoskeletal: No kyphosis. Neuropsychiatric: Alert and oriented x3, affect grossly appropriate.  ECG:  An ECG dated 05/05/2021 was personally reviewed today and demonstrated:  Sinus rhythm with prolonged PR interval.  Labwork:  June 2023: BUN 17, creatinine 1.03, potassium 4.2, AST 14, ALT 14, cholesterol 171, triglycerides 460, HDL 24, LDL 74, TSH 2.5   Other Studies Reviewed Today:  No interval cardiac testing for review today.  Assessment and Plan:  1.  CAD status post DES to the circumflex/OM in 2011 with repeat DES for treatment of in-stent restenosis in 2020.  Moderate residual disease in the mid LAD and RCA/PDA managed medically at that time.  LVEF 62%.  2.  History of Wolff-Parkinson-White syndrome status post radiofrequency ablation by Dr. Graciela Husbands in 2005.  3.  Essential hypertension.  4.  Mixed hyperlipidemia.  LDL 74 in June 2023.  Disposition:  Follow up {follow up:15908}  Signed, Jonelle Sidle, M.D., F.A.C.C. [facility] at  [facility]

## 2022-06-26 ENCOUNTER — Ambulatory Visit: Payer: BC Managed Care – PPO | Attending: Cardiology | Admitting: Cardiology

## 2022-06-26 ENCOUNTER — Encounter: Payer: Self-pay | Admitting: Cardiology

## 2022-06-26 VITALS — BP 118/70 | HR 86 | Ht 72.0 in | Wt 215.2 lb

## 2022-06-26 DIAGNOSIS — I10 Essential (primary) hypertension: Secondary | ICD-10-CM | POA: Diagnosis not present

## 2022-06-26 DIAGNOSIS — E782 Mixed hyperlipidemia: Secondary | ICD-10-CM

## 2022-06-26 DIAGNOSIS — I25119 Atherosclerotic heart disease of native coronary artery with unspecified angina pectoris: Secondary | ICD-10-CM | POA: Diagnosis not present

## 2022-06-26 MED ORDER — NITROGLYCERIN 0.4 MG SL SUBL: 0.4000 mg | SUBLINGUAL_TABLET | SUBLINGUAL | 3 refills | Status: DC | PRN

## 2022-06-26 NOTE — Patient Instructions (Addendum)

## 2022-12-19 ENCOUNTER — Other Ambulatory Visit: Payer: Self-pay | Admitting: Cardiology

## 2023-01-04 ENCOUNTER — Ambulatory Visit: Payer: BC Managed Care – PPO | Attending: Cardiology | Admitting: Cardiology

## 2023-01-04 ENCOUNTER — Encounter: Payer: Self-pay | Admitting: Cardiology

## 2023-01-04 VITALS — BP 104/62 | HR 77 | Ht 72.0 in | Wt 212.6 lb

## 2023-01-04 DIAGNOSIS — I25119 Atherosclerotic heart disease of native coronary artery with unspecified angina pectoris: Secondary | ICD-10-CM

## 2023-01-04 DIAGNOSIS — E782 Mixed hyperlipidemia: Secondary | ICD-10-CM | POA: Diagnosis not present

## 2023-01-04 DIAGNOSIS — I10 Essential (primary) hypertension: Secondary | ICD-10-CM

## 2023-01-04 MED ORDER — NITROGLYCERIN 0.4 MG SL SUBL: 0.4000 mg | SUBLINGUAL_TABLET | SUBLINGUAL | 3 refills | Status: AC | PRN

## 2023-01-04 NOTE — Progress Notes (Signed)
    Cardiology Office Note  Date: 01/04/2023   ID: Lawrence [name], DOB 1960/01/30, MRN [id number]  History of Present Illness: Lawrence [name] is a 62 y.o. male last seen in June.  He is here for a routine visit.  Reports no angina or nitroglycerin use, stable NYHA class II dyspnea.  No palpitations or syncope.  I reviewed his medications.  Current cardiac regimen includes aspirin, Prinzide, Lopressor, Crestor, and as needed nitroglycerin.  He is also on Ozempic as part of his diabetic regimen.  States that he will be retiring at the end of December.  Plans to spend time with his grandchildren.  Physical Exam: VS:  BP 104/62   Pulse 77   Ht 6' (1.829 m)   Wt 212 lb 9.6 oz (96.4 kg)   SpO2 94%   BMI 28.83 kg/m , BMI Body mass index is 28.83 kg/m.  Wt Readings from Last 3 Encounters:  01/04/23 212 lb 9.6 oz (96.4 kg)  06/26/22 215 lb 3.2 oz (97.6 kg)  12/02/21 218 lb (98.9 kg)    General: Patient appears comfortable at rest. HEENT: Conjunctiva and lids normal. Neck: Supple, no elevated JVP or carotid bruits. Lungs: Clear to auscultation, nonlabored breathing at rest. Cardiac: Regular rate and rhythm, no S3 or significant systolic murmur, no pericardial rub.  ECG:  An ECG dated 06/26/2022 was personally reviewed today and demonstrated:  Sinus rhythm with PVC.  Labwork:  June 2023: BUN 17, creatinine 1.03, potassium 4.2, AST 14, ALT 14, cholesterol 171, triglycerides 460, HDL 24, LDL 74, TSH 2.5  Other Studies Reviewed Today:  No interval cardiac testing for review today.  Assessment and Plan:  1.  CAD status post DES to the circumflex/OM in 2011 with repeat DES for treatment of in-stent restenosis in 2020.  Moderate residual disease in the mid LAD and RCA/PDA managed medically at that time.  LVEF 62%.  He remains clinically stable without active angina or nitroglycerin use (refill provided).  Continue aspirin, Lopressor, and Crestor.   2.  History of Wolff-Parkinson-White  syndrome status post radiofrequency ablation by Dr. Graciela Husbands in 2005.  Quiescent.   3.  Primary hypertension.  Blood pressure well-controlled today.  Continue Prinzide.   4.  Mixed hyperlipidemia.  LDL 74 in June 2023.  He is overdue for follow-up lab work with PCP.  Disposition:  Follow up  6 months.  Signed, Jonelle Sidle, M.D., F.A.C.C. [facility] at [facility]

## 2023-01-04 NOTE — Patient Instructions (Signed)

## 2023-09-14 ENCOUNTER — Encounter: Payer: Self-pay | Admitting: Cardiology

## 2023-09-14 ENCOUNTER — Ambulatory Visit: Attending: Cardiology | Admitting: Cardiology

## 2023-09-14 VITALS — BP 99/50 | HR 67 | Ht 72.0 in | Wt 204.4 lb

## 2023-09-14 DIAGNOSIS — I456 Pre-excitation syndrome: Secondary | ICD-10-CM | POA: Diagnosis not present

## 2023-09-14 DIAGNOSIS — E782 Mixed hyperlipidemia: Secondary | ICD-10-CM

## 2023-09-14 DIAGNOSIS — I25119 Atherosclerotic heart disease of native coronary artery with unspecified angina pectoris: Secondary | ICD-10-CM

## 2023-09-14 DIAGNOSIS — I10 Essential (primary) hypertension: Secondary | ICD-10-CM

## 2023-09-14 NOTE — Patient Instructions (Addendum)
 Medication Instructions:  Your physician has recommended you make the following change in your medication: Restart fish oil  2000 mg twice daily Continue all other medications as prescribed  Labwork: none  Testing/Procedures: none  Follow-Up: Your physician recommends that you schedule a follow-up appointment in: 6 months  Any Other Special Instructions Will Be Listed Below (If Applicable).  If you need a refill on your cardiac medications before your next appointment, please call your pharmacy.

## 2023-09-14 NOTE — Addendum Note (Signed)
 Addended by: Adelaine Roppolo M on: 09/14/2023 04:34 PM   Modules accepted: Orders

## 2023-09-14 NOTE — Progress Notes (Signed)
    Cardiology Office Note  Date: 09/14/2023   ID: Lawrence [name], DOB [birth year], MRN [id number]  History of Present Illness: Lawrence [name] is a 63 y.o. male last seen in December 2024.  He is here for a routine visit.  Reports intermittent dyspnea on exertion but no chest tightness or nitroglycerin  use.  No palpitations or syncope.  We discussed his medications today.  He states that he has not been taking omega-3 supplements for quite some time.  He cut his Crestor  40 mg tablets back to every other day due to myalgias and has felt better.  His most recent lipid panel unfortunately shows triglycerides up to 1596, LDL was not calculated.  He states that he was switched from Prinzide  to Benicar by Dr. Toribio given concerns about laryngospasm, although he did not make the switch and his symptoms went away.  I reviewed his ECG today which shows sinus rhythm with early repolarization.  Physical Exam: VS:  BP (!) 99/50   Pulse 67   Ht 6' (1.829 m)   Wt 204 lb 6.4 oz (92.7 kg)   SpO2 96%   BMI 27.72 kg/m , BMI Body mass index is 27.72 kg/m.  Wt Readings from Last 3 Encounters:  09/14/23 204 lb 6.4 oz (92.7 kg)  01/04/23 212 lb 9.6 oz (96.4 kg)  06/26/22 215 lb 3.2 oz (97.6 kg)    General: Patient appears comfortable at rest. HEENT: Conjunctiva and lids normal. Neck: Supple, no elevated JVP or carotid bruits. Lungs: Clear to auscultation, nonlabored breathing at rest. Cardiac: Regular rate and rhythm, no S3 or significant systolic murmur.  ECG:  An ECG dated 06/26/2022 was personally reviewed today and demonstrated:  Sinus rhythm with PVC.  Labwork:  August 2025: Cholesterol 283, triglycerides 1596, HDL 16, LDL not calculated, BUN 15, creatinine 0.98, GFR 87, potassium 4.9, AST 19, ALT 21  Other Studies Reviewed Today:  No interval cardiac testing for review today.  Assessment and Plan:  1.  CAD status post DES to the circumflex/OM in 2011 with repeat DES for treatment of  in-stent restenosis in 2020.  Moderate residual disease in the mid LAD and RCA/PDA managed medically at that time.  LVEF 62%.  He does not report any definite angina or interval nitroglycerin  use, does have intermittent dyspnea on exertion.  Plan to continue medical therapy unless symptoms escalate.  Currently on aspirin  81 mg daily, Crestor  40 mg every other day, and as needed nitroglycerin .   2.  History of Wolff-Parkinson-White syndrome status post radiofrequency ablation by Dr. Fernande in 2005.  Quiescent.   3.  Primary hypertension.  He has elected to stay on Prinzide  10/12.5 mg daily, did not switch to Benicar which had been prescribed by Dr. Toribio.  Also on Lipitor 50 mg twice daily.   4.  Mixed hyperlipidemia.  Has severe hypertriglyceridemia as discussed above, I have recommended that he go back on omega-3 supplements at 2000 mg twice daily.  Discussed risk of pancreatitis.  For now would continue Crestor  40 mg every other day.  Disposition:  Follow up 6 months.  Signed, Jayson JUDITHANN Sierras, M.D., F.A.C.C. [facility] at [facility]

## 2023-09-20 ENCOUNTER — Other Ambulatory Visit: Payer: Self-pay | Admitting: Cardiology
